# Patient Record
Sex: Male | Born: 1975 | Race: White | Hispanic: No | Marital: Single | Smoking: Never smoker
Health system: Southern US, Community
[De-identification: ages and names within clinical notes are randomized; demographics above are authoritative.]

## PROBLEM LIST (undated history)

## (undated) DIAGNOSIS — K921 Melena: Secondary | ICD-10-CM

## (undated) DIAGNOSIS — Z8619 Personal history of other infectious and parasitic diseases: Secondary | ICD-10-CM

## (undated) DIAGNOSIS — G43909 Migraine, unspecified, not intractable, without status migrainosus: Secondary | ICD-10-CM

## (undated) DIAGNOSIS — N200 Calculus of kidney: Secondary | ICD-10-CM

## (undated) DIAGNOSIS — K219 Gastro-esophageal reflux disease without esophagitis: Secondary | ICD-10-CM

## (undated) DIAGNOSIS — J9383 Other pneumothorax: Secondary | ICD-10-CM

## (undated) DIAGNOSIS — T7840XA Allergy, unspecified, initial encounter: Secondary | ICD-10-CM

## (undated) HISTORY — DX: Calculus of kidney: N20.0

## (undated) HISTORY — DX: Other pneumothorax: J93.83

## (undated) HISTORY — DX: Migraine, unspecified, not intractable, without status migrainosus: G43.909

## (undated) HISTORY — DX: Gastro-esophageal reflux disease without esophagitis: K21.9

## (undated) HISTORY — DX: Personal history of other infectious and parasitic diseases: Z86.19

## (undated) HISTORY — DX: Melena: K92.1

## (undated) HISTORY — DX: Allergy, unspecified, initial encounter: T78.40XA

---
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
## 1999-07-22 ENCOUNTER — Emergency Department (HOSPITAL_COMMUNITY): Admission: EM | Admit: 1999-07-22 | Discharge: 1999-07-22 | Payer: Self-pay | Admitting: Emergency Medicine

## 2003-05-16 ENCOUNTER — Encounter: Payer: Self-pay | Admitting: Family Medicine

## 2003-05-16 ENCOUNTER — Ambulatory Visit (HOSPITAL_COMMUNITY): Admission: RE | Admit: 2003-05-16 | Discharge: 2003-05-16 | Payer: Self-pay | Admitting: Family Medicine

## 2003-05-22 ENCOUNTER — Ambulatory Visit (HOSPITAL_COMMUNITY): Admission: RE | Admit: 2003-05-22 | Discharge: 2003-05-22 | Payer: Self-pay | Admitting: Family Medicine

## 2003-05-22 ENCOUNTER — Encounter: Payer: Self-pay | Admitting: Family Medicine

## 2003-05-26 ENCOUNTER — Encounter: Payer: Self-pay | Admitting: Family Medicine

## 2003-05-26 ENCOUNTER — Ambulatory Visit (HOSPITAL_COMMUNITY): Admission: RE | Admit: 2003-05-26 | Discharge: 2003-05-26 | Payer: Self-pay | Admitting: Family Medicine

## 2010-12-29 LAB — HM COLONOSCOPY

## 2012-07-16 ENCOUNTER — Telehealth: Payer: Self-pay | Admitting: Internal Medicine

## 2012-07-16 NOTE — Telephone Encounter (Signed)
Spoke with mother, pt scheduled 08/09/2012 @ 9:45am

## 2012-07-16 NOTE — Telephone Encounter (Signed)
Ok Thx 

## 2012-08-09 ENCOUNTER — Encounter: Payer: Self-pay | Admitting: Internal Medicine

## 2012-08-09 ENCOUNTER — Ambulatory Visit (INDEPENDENT_AMBULATORY_CARE_PROVIDER_SITE_OTHER): Payer: Managed Care, Other (non HMO) | Admitting: Internal Medicine

## 2012-08-09 ENCOUNTER — Other Ambulatory Visit (INDEPENDENT_AMBULATORY_CARE_PROVIDER_SITE_OTHER): Payer: Managed Care, Other (non HMO)

## 2012-08-09 VITALS — BP 122/88 | HR 103 | Temp 98.3°F | Ht 76.0 in | Wt 267.0 lb

## 2012-08-09 DIAGNOSIS — R0600 Dyspnea, unspecified: Secondary | ICD-10-CM

## 2012-08-09 DIAGNOSIS — E669 Obesity, unspecified: Secondary | ICD-10-CM | POA: Insufficient documentation

## 2012-08-09 DIAGNOSIS — R1012 Left upper quadrant pain: Secondary | ICD-10-CM

## 2012-08-09 DIAGNOSIS — I10 Essential (primary) hypertension: Secondary | ICD-10-CM

## 2012-08-09 DIAGNOSIS — F32A Depression, unspecified: Secondary | ICD-10-CM

## 2012-08-09 DIAGNOSIS — F329 Major depressive disorder, single episode, unspecified: Secondary | ICD-10-CM | POA: Insufficient documentation

## 2012-08-09 DIAGNOSIS — R0989 Other specified symptoms and signs involving the circulatory and respiratory systems: Secondary | ICD-10-CM

## 2012-08-09 DIAGNOSIS — K219 Gastro-esophageal reflux disease without esophagitis: Secondary | ICD-10-CM

## 2012-08-09 DIAGNOSIS — F419 Anxiety disorder, unspecified: Secondary | ICD-10-CM | POA: Insufficient documentation

## 2012-08-09 DIAGNOSIS — F341 Dysthymic disorder: Secondary | ICD-10-CM

## 2012-08-09 LAB — HEPATIC FUNCTION PANEL
Albumin: 4.9 g/dL (ref 3.5–5.2)
Total Bilirubin: 1.1 mg/dL (ref 0.3–1.2)

## 2012-08-09 LAB — CBC WITH DIFFERENTIAL/PLATELET
Basophils Absolute: 0 K/uL (ref 0.0–0.1)
Basophils Relative: 0.4 % (ref 0.0–3.0)
Eosinophils Absolute: 0.4 K/uL (ref 0.0–0.7)
Eosinophils Relative: 3.3 % (ref 0.0–5.0)
HCT: 44.3 % (ref 39.0–52.0)
Hemoglobin: 14.5 g/dL (ref 13.0–17.0)
Lymphocytes Relative: 26 % (ref 12.0–46.0)
Lymphs Abs: 2.8 K/uL (ref 0.7–4.0)
MCHC: 32.8 g/dL (ref 30.0–36.0)
MCV: 94.9 fl (ref 78.0–100.0)
Monocytes Absolute: 0.7 K/uL (ref 0.1–1.0)
Monocytes Relative: 7 % (ref 3.0–12.0)
Neutro Abs: 6.7 K/uL (ref 1.4–7.7)
Neutrophils Relative %: 63.3 % (ref 43.0–77.0)
Platelets: 250 K/uL (ref 150.0–400.0)
RBC: 4.67 Mil/uL (ref 4.22–5.81)
RDW: 14 % (ref 11.5–14.6)
WBC: 10.6 K/uL — ABNORMAL HIGH (ref 4.5–10.5)

## 2012-08-09 LAB — BASIC METABOLIC PANEL WITH GFR
BUN: 20 mg/dL (ref 6–23)
CO2: 27 meq/L (ref 19–32)
Calcium: 9.9 mg/dL (ref 8.4–10.5)
Chloride: 100 meq/L (ref 96–112)
Creatinine, Ser: 1.2 mg/dL (ref 0.4–1.5)
GFR: 70.43 mL/min
Glucose, Bld: 85 mg/dL (ref 70–99)
Potassium: 4.4 meq/L (ref 3.5–5.1)
Sodium: 137 meq/L (ref 135–145)

## 2012-08-09 LAB — URINALYSIS
Bilirubin Urine: NEGATIVE
Hgb urine dipstick: NEGATIVE
Ketones, ur: NEGATIVE
Total Protein, Urine: NEGATIVE
Urine Glucose: NEGATIVE
Urobilinogen, UA: 0.2 (ref 0.0–1.0)

## 2012-08-09 LAB — H. PYLORI ANTIBODY, IGG: H Pylori IgG: NEGATIVE

## 2012-08-09 LAB — LIPID PANEL
Cholesterol: 245 mg/dL — ABNORMAL HIGH (ref 0–200)
HDL: 54.2 mg/dL (ref >39.00)
Total CHOL/HDL Ratio: 5
VLDL: 32.6 mg/dL (ref 0.0–40.0)

## 2012-08-09 MED ORDER — ALPRAZOLAM 1 MG PO TABS: 1.0000 mg | ORAL_TABLET | Freq: Two times a day (BID) | ORAL | Status: DC | PRN

## 2012-08-09 MED ORDER — LOSARTAN POTASSIUM 100 MG PO TABS: 100.0000 mg | ORAL_TABLET | Freq: Every day | ORAL | Status: DC

## 2012-08-09 MED ORDER — VITAMIN D 1000 UNITS PO TABS: 1000.0000 [IU] | ORAL_TABLET | Freq: Every day | ORAL | Status: AC

## 2012-08-09 MED ORDER — BUPROPION HCL ER (XL) 150 MG PO TB24: 150.0000 mg | ORAL_TABLET | Freq: Every day | ORAL | Status: DC

## 2012-08-09 NOTE — Assessment & Plan Note (Signed)
Omeprazole 40 mg/d Loose wt Labs

## 2012-08-09 NOTE — Progress Notes (Signed)
  Subjective:    Patient ID: Jacob Blackburn, male    DOB: 1976-03-03, 36 y.o.   MRN: [id number]  HPI A new pt C/o chronic abd pain - dx'd w/IBS by Dr Orlean Bradford (GI) -- LUQ - better now. He had a colonoscopy done C/o HTN, obesity, anxiety - worse -- stressful job C/o SOB after he started Lisinopril    Review of Systems  Constitutional: Negative for appetite change, fatigue and unexpected weight change.  HENT: Negative for nosebleeds, congestion, sore throat, sneezing, trouble swallowing and neck pain.   Eyes: Negative for itching and visual disturbance.  Respiratory: Negative for cough.   Cardiovascular: Negative for chest pain, palpitations and leg swelling.  Gastrointestinal: Positive for abdominal pain (LUQ). Negative for nausea, vomiting, diarrhea, blood in stool and abdominal distention.  Genitourinary: Negative for frequency and hematuria.  Musculoskeletal: Negative for back pain, joint swelling and gait problem.  Skin: Negative for rash.  Neurological: Negative for dizziness, tremors, speech difficulty and weakness.  Psychiatric/Behavioral: Negative for suicidal ideas, disturbed wake/sleep cycle, dysphoric mood and agitation. The patient is nervous/anxious.    BP Readings from Last 3 Encounters:  08/09/12 122/88   Wt Readings from Last 3 Encounters:  08/09/12 267 lb (121.11 kg)        Objective:   Physical Exam  Constitutional: He is oriented to person, place, and time. He appears well-developed.       obese  HENT:  Mouth/Throat: Oropharynx is clear and moist.  Eyes: Conjunctivae are normal. Pupils are equal, round, and reactive to light.  Neck: Normal range of motion. No JVD present. No thyromegaly present.  Cardiovascular: Normal rate, regular rhythm, normal heart sounds and intact distal pulses.  Exam reveals no gallop and no friction rub.   No murmur heard. Pulmonary/Chest: Effort normal and breath sounds normal. No respiratory distress. He has no wheezes. He has no  rales. He exhibits no tenderness.  Abdominal: Soft. Bowel sounds are normal. He exhibits no distension and no mass. There is no tenderness. There is no rebound and no guarding.  Musculoskeletal: Normal range of motion. He exhibits no edema and no tenderness.  Lymphadenopathy:    He has no cervical adenopathy.  Neurological: He is alert and oriented to person, place, and time. He has normal reflexes. No cranial nerve deficit. He exhibits normal muscle tone. Coordination normal.  Skin: Skin is warm and dry. No rash noted.  Psychiatric: He has a normal mood and affect. His behavior is normal. Judgment and thought content normal.   No results found for this basename: WBC, HGB, HCT, PLT, GLUCOSE, CHOL, TRIG, HDL, LDLDIRECT, LDLCALC, ALT, AST, NA, K, CL, CREATININE, BUN, CO2, TSH, PSA, INR, GLUF, HGBA1C, MICROALBUR          Assessment & Plan:

## 2012-08-09 NOTE — Assessment & Plan Note (Signed)
DASH diet - loosing wt

## 2012-08-09 NOTE — Assessment & Plan Note (Signed)
Start Losartan 

## 2012-08-09 NOTE — Assessment & Plan Note (Signed)
2012 Dr Orlean Bradford GI - he had a colonoscopy -- IBS

## 2012-08-09 NOTE — Assessment & Plan Note (Signed)
Increase Xanax w/caution Trial of Wellbutrin

## 2012-08-09 NOTE — Assessment & Plan Note (Signed)
D/c Lisinopril 

## 2012-08-10 ENCOUNTER — Telehealth: Payer: Self-pay | Admitting: Internal Medicine

## 2012-08-10 NOTE — Telephone Encounter (Signed)
Mother notified per MD. 

## 2012-08-10 NOTE — Telephone Encounter (Signed)
Jacob Blackburn, please, inform patient that all labs are normal except for elev chol and one elev liver test. Cont w/wt loss. Keep ROV  Please, mail the labs to the patient.     Thx

## 2012-10-25 ENCOUNTER — Ambulatory Visit (INDEPENDENT_AMBULATORY_CARE_PROVIDER_SITE_OTHER): Payer: Managed Care, Other (non HMO) | Admitting: Internal Medicine

## 2012-10-25 ENCOUNTER — Encounter: Payer: Self-pay | Admitting: Internal Medicine

## 2012-10-25 VITALS — BP 110/72 | HR 88 | Temp 98.9°F | Resp 16 | Wt 251.0 lb

## 2012-10-25 DIAGNOSIS — K219 Gastro-esophageal reflux disease without esophagitis: Secondary | ICD-10-CM

## 2012-10-25 DIAGNOSIS — R0609 Other forms of dyspnea: Secondary | ICD-10-CM

## 2012-10-25 DIAGNOSIS — F329 Major depressive disorder, single episode, unspecified: Secondary | ICD-10-CM

## 2012-10-25 DIAGNOSIS — F341 Dysthymic disorder: Secondary | ICD-10-CM

## 2012-10-25 DIAGNOSIS — Z23 Encounter for immunization: Secondary | ICD-10-CM

## 2012-10-25 DIAGNOSIS — R7989 Other specified abnormal findings of blood chemistry: Secondary | ICD-10-CM

## 2012-10-25 DIAGNOSIS — I10 Essential (primary) hypertension: Secondary | ICD-10-CM

## 2012-10-25 DIAGNOSIS — R0989 Other specified symptoms and signs involving the circulatory and respiratory systems: Secondary | ICD-10-CM

## 2012-10-25 DIAGNOSIS — F32A Depression, unspecified: Secondary | ICD-10-CM

## 2012-10-25 DIAGNOSIS — R0600 Dyspnea, unspecified: Secondary | ICD-10-CM

## 2012-10-25 MED ORDER — ALPRAZOLAM 1 MG PO TABS: 1.0000 mg | ORAL_TABLET | Freq: Two times a day (BID) | ORAL | Status: DC | PRN

## 2012-10-25 NOTE — Assessment & Plan Note (Signed)
Continue with current prescription therapy as reflected on the Med list.  

## 2012-10-25 NOTE — Assessment & Plan Note (Signed)
Much better 

## 2012-10-25 NOTE — Assessment & Plan Note (Signed)
Resolved

## 2012-10-25 NOTE — Progress Notes (Signed)
Patient ID: Jacob Blackburn, male   DOB: 04/30/1976, 36 y.o.   MRN: [id number]  Subjective:    Patient ID: Jacob Blackburn, male    DOB: 1976/10/09, 36 y.o.   MRN: [id number]  HPI  F/u chronic abd pain - dx'd w/IBS by Dr Orlean Bradford (GI) -- LUQ - better now. He had a colonoscopy done F/u HTN, obesity, anxiety - better F/u SOB after he stopped Lisinopril    Review of Systems  Constitutional: Negative for appetite change, fatigue and unexpected weight change.  HENT: Negative for nosebleeds, congestion, sore throat, sneezing, trouble swallowing and neck pain.   Eyes: Negative for itching and visual disturbance.  Respiratory: Negative for cough.   Cardiovascular: Negative for chest pain, palpitations and leg swelling.  Gastrointestinal: Positive for abdominal pain (LUQ). Negative for nausea, vomiting, diarrhea, blood in stool and abdominal distention.  Genitourinary: Negative for frequency and hematuria.  Musculoskeletal: Negative for back pain, joint swelling and gait problem.  Skin: Negative for rash.  Neurological: Negative for dizziness, tremors, speech difficulty and weakness.  Psychiatric/Behavioral: Negative for suicidal ideas, disturbed wake/sleep cycle, dysphoric mood and agitation. The patient is nervous/anxious.    BP Readings from Last 3 Encounters:  10/25/12 110/72  08/09/12 122/88   Wt Readings from Last 3 Encounters:  10/25/12 251 lb (113.853 kg)  08/09/12 267 lb (121.11 kg)        Objective:   Physical Exam  Constitutional: He is oriented to person, place, and time. He appears well-developed.       obese  HENT:  Mouth/Throat: Oropharynx is clear and moist.  Eyes: Conjunctivae normal are normal. Pupils are equal, round, and reactive to light.  Neck: Normal range of motion. No JVD present. No thyromegaly present.  Cardiovascular: Normal rate, regular rhythm, normal heart sounds and intact distal pulses.  Exam reveals no gallop and no friction rub.   No murmur  heard. Pulmonary/Chest: Effort normal and breath sounds normal. No respiratory distress. He has no wheezes. He has no rales. He exhibits no tenderness.  Abdominal: Soft. Bowel sounds are normal. He exhibits no distension and no mass. There is no tenderness. There is no rebound and no guarding.  Musculoskeletal: Normal range of motion. He exhibits no edema and no tenderness.  Lymphadenopathy:    He has no cervical adenopathy.  Neurological: He is alert and oriented to person, place, and time. He has normal reflexes. No cranial nerve deficit. He exhibits normal muscle tone. Coordination normal.  Skin: Skin is warm and dry. No rash noted.  Psychiatric: He has a normal mood and affect. His behavior is normal. Judgment and thought content normal.   Lab Results  Component Value Date   WBC 10.6* 08/09/2012   HGB 14.5 08/09/2012   HCT 44.3 08/09/2012   PLT 250.0 08/09/2012   GLUCOSE 85 08/09/2012   CHOL 245* 08/09/2012   TRIG 163.0* 08/09/2012   HDL 54.20 08/09/2012   LDLDIRECT 174.8 08/09/2012   ALT 79* 08/09/2012   AST 32 08/09/2012   NA 137 08/09/2012   K 4.4 08/09/2012   CL 100 08/09/2012   CREATININE 1.2 08/09/2012   BUN 20 08/09/2012   CO2 27 08/09/2012   TSH 4.82 08/09/2012           Assessment & Plan:

## 2012-10-25 NOTE — Assessment & Plan Note (Signed)
Better  

## 2012-12-10 ENCOUNTER — Other Ambulatory Visit: Payer: Self-pay | Admitting: Internal Medicine

## 2012-12-10 NOTE — Telephone Encounter (Signed)
Pt left msg on vm his last rx was incorrect on his alprazolam. Pt states he normally get # 60 was given only 30 tab. Requesting rx for alprazolam # 60, also need refill on his omeprazole...Raechel Chute

## 2012-12-10 NOTE — Telephone Encounter (Signed)
Ok to Rf? 

## 2012-12-14 ENCOUNTER — Telehealth: Payer: Self-pay | Admitting: Internal Medicine

## 2012-12-14 MED ORDER — OMEPRAZOLE 40 MG PO CPDR: 40.0000 mg | DELAYED_RELEASE_CAPSULE | Freq: Every day | ORAL | Status: DC

## 2012-12-14 NOTE — Telephone Encounter (Signed)
Done

## 2012-12-14 NOTE — Telephone Encounter (Signed)
Ok Thx 

## 2012-12-14 NOTE — Telephone Encounter (Signed)
Pt called requesting his 2 refills.

## 2012-12-14 NOTE — Telephone Encounter (Signed)
Done. See Rx encounter.

## 2013-01-10 ENCOUNTER — Telehealth: Payer: Self-pay | Admitting: Internal Medicine

## 2013-01-10 NOTE — Telephone Encounter (Signed)
Requesting a refill on Xanax 1mg  quantity of 60 which will be due around Jan 17.

## 2013-01-10 NOTE — Telephone Encounter (Signed)
OK to fill this prescription with additional refills x2 Thank you!  

## 2013-01-11 MED ORDER — ALPRAZOLAM 1 MG PO TABS: 1.0000 mg | ORAL_TABLET | Freq: Two times a day (BID) | ORAL | Status: DC | PRN

## 2013-01-11 NOTE — Telephone Encounter (Signed)
Done

## 2013-01-31 ENCOUNTER — Ambulatory Visit (INDEPENDENT_AMBULATORY_CARE_PROVIDER_SITE_OTHER): Payer: Managed Care, Other (non HMO) | Admitting: Internal Medicine

## 2013-01-31 ENCOUNTER — Encounter: Payer: Self-pay | Admitting: Internal Medicine

## 2013-01-31 ENCOUNTER — Telehealth: Payer: Self-pay | Admitting: Internal Medicine

## 2013-01-31 ENCOUNTER — Other Ambulatory Visit (INDEPENDENT_AMBULATORY_CARE_PROVIDER_SITE_OTHER): Payer: Managed Care, Other (non HMO)

## 2013-01-31 VITALS — BP 122/88 | HR 96 | Temp 98.5°F | Resp 16 | Wt 230.0 lb

## 2013-01-31 DIAGNOSIS — R7989 Other specified abnormal findings of blood chemistry: Secondary | ICD-10-CM

## 2013-01-31 DIAGNOSIS — R5383 Other fatigue: Secondary | ICD-10-CM

## 2013-01-31 DIAGNOSIS — R5381 Other malaise: Secondary | ICD-10-CM

## 2013-01-31 DIAGNOSIS — I10 Essential (primary) hypertension: Secondary | ICD-10-CM

## 2013-01-31 DIAGNOSIS — K219 Gastro-esophageal reflux disease without esophagitis: Secondary | ICD-10-CM

## 2013-01-31 LAB — LIPID PANEL
Total CHOL/HDL Ratio: 7
VLDL: 20 mg/dL (ref 0.0–40.0)

## 2013-01-31 LAB — HEPATIC FUNCTION PANEL
Albumin: 4.5 g/dL (ref 3.5–5.2)
Alkaline Phosphatase: 91 U/L (ref 39–117)
Bilirubin, Direct: 0.1 mg/dL (ref 0.0–0.3)

## 2013-01-31 LAB — BASIC METABOLIC PANEL
BUN: 11 mg/dL (ref 6–23)
CO2: 29 mEq/L (ref 19–32)
Chloride: 104 mEq/L (ref 96–112)
Creatinine, Ser: 1.3 mg/dL (ref 0.4–1.5)

## 2013-01-31 MED ORDER — BUPROPION HCL ER (XL) 150 MG PO TB24: 150.0000 mg | ORAL_TABLET | Freq: Every day | ORAL | Status: DC

## 2013-01-31 MED ORDER — OMEPRAZOLE 40 MG PO CPDR: 40.0000 mg | DELAYED_RELEASE_CAPSULE | Freq: Every day | ORAL | Status: DC

## 2013-01-31 MED ORDER — ALPRAZOLAM 1 MG PO TABS: 1.0000 mg | ORAL_TABLET | Freq: Two times a day (BID) | ORAL | Status: DC | PRN

## 2013-01-31 NOTE — Assessment & Plan Note (Signed)
Labs incl testosterone 

## 2013-01-31 NOTE — Progress Notes (Signed)
  Subjective:    HPI  F/u chronic abd pain - dx'd w/IBS by Dr Orlean Bradford (GI) -- LUQ - overall better now. He had a colonoscopy done F/u HTN, obesity, anxiety - better Loosing wt on diet    Review of Systems  Constitutional: Negative for appetite change, fatigue and unexpected weight change.  HENT: Negative for nosebleeds, congestion, sore throat, sneezing, trouble swallowing and neck pain.   Eyes: Negative for itching and visual disturbance.  Respiratory: Negative for cough.   Cardiovascular: Negative for chest pain, palpitations and leg swelling.  Gastrointestinal: Positive for abdominal pain (LUQ). Negative for nausea, vomiting, diarrhea, blood in stool and abdominal distention.  Genitourinary: Negative for frequency and hematuria.  Musculoskeletal: Negative for back pain, joint swelling and gait problem.  Skin: Negative for rash.  Neurological: Negative for dizziness, tremors, speech difficulty and weakness.  Psychiatric/Behavioral: Negative for suicidal ideas, sleep disturbance, dysphoric mood and agitation. The patient is nervous/anxious.    BP Readings from Last 3 Encounters:  01/31/13 122/88  10/25/12 110/72  08/09/12 122/88   Wt Readings from Last 3 Encounters:  01/31/13 230 lb (104.327 kg)  10/25/12 251 lb (113.853 kg)  08/09/12 267 lb (121.11 kg)        Objective:   Physical Exam  Constitutional: He is oriented to person, place, and time. He appears well-developed.       obese  HENT:  Mouth/Throat: Oropharynx is clear and moist.  Eyes: Conjunctivae normal are normal. Pupils are equal, round, and reactive to light.  Neck: Normal range of motion. No JVD present. No thyromegaly present.  Cardiovascular: Normal rate, regular rhythm, normal heart sounds and intact distal pulses.  Exam reveals no gallop and no friction rub.   No murmur heard. Pulmonary/Chest: Effort normal and breath sounds normal. No respiratory distress. He has no wheezes. He has no rales. He exhibits  no tenderness.  Abdominal: Soft. Bowel sounds are normal. He exhibits no distension and no mass. There is no tenderness. There is no rebound and no guarding.  Musculoskeletal: Normal range of motion. He exhibits no edema and no tenderness.  Lymphadenopathy:    He has no cervical adenopathy.  Neurological: He is alert and oriented to person, place, and time. He has normal reflexes. No cranial nerve deficit. He exhibits normal muscle tone. Coordination normal.  Skin: Skin is warm and dry. No rash noted.  Psychiatric: He has a normal mood and affect. His behavior is normal. Judgment and thought content normal.   Lab Results  Component Value Date   WBC 10.6* 08/09/2012   HGB 14.5 08/09/2012   HCT 44.3 08/09/2012   PLT 250.0 08/09/2012   GLUCOSE 85 08/09/2012   CHOL 245* 08/09/2012   TRIG 163.0* 08/09/2012   HDL 54.20 08/09/2012   LDLDIRECT 174.8 08/09/2012   ALT 79* 08/09/2012   AST 32 08/09/2012   NA 137 08/09/2012   K 4.4 08/09/2012   CL 100 08/09/2012   CREATININE 1.2 08/09/2012   BUN 20 08/09/2012   CO2 27 08/09/2012   TSH 4.82 08/09/2012           Assessment & Plan:

## 2013-01-31 NOTE — Telephone Encounter (Signed)
Jacob Blackburn - pls call labs/tests are good. Your testosterone was low however. Start exercising (<45 min a day muscle building - pushups, crunches an pullups are ok). Take Vit D. Eat wallnuts, almonds. We will repeat labs and get some additional tests in 1 month. Thx

## 2013-01-31 NOTE — Assessment & Plan Note (Signed)
Discussed.

## 2013-01-31 NOTE — Assessment & Plan Note (Signed)
Repeat labs He lost wt

## 2013-01-31 NOTE — Assessment & Plan Note (Signed)
Continue with current prescription therapy as reflected on the Med list.  

## 2013-02-01 NOTE — Telephone Encounter (Signed)
Called pt no answer/machine.   Will try again later.

## 2013-02-03 NOTE — Telephone Encounter (Signed)
Pt informed

## 2013-08-10 ENCOUNTER — Ambulatory Visit (INDEPENDENT_AMBULATORY_CARE_PROVIDER_SITE_OTHER): Payer: Managed Care, Other (non HMO)

## 2013-08-10 ENCOUNTER — Encounter: Payer: Self-pay | Admitting: Internal Medicine

## 2013-08-10 ENCOUNTER — Ambulatory Visit (INDEPENDENT_AMBULATORY_CARE_PROVIDER_SITE_OTHER): Payer: Managed Care, Other (non HMO) | Admitting: Internal Medicine

## 2013-08-10 VITALS — BP 110/82 | HR 84 | Temp 98.1°F | Resp 16 | Ht 76.0 in | Wt 216.0 lb

## 2013-08-10 DIAGNOSIS — R0609 Other forms of dyspnea: Secondary | ICD-10-CM

## 2013-08-10 DIAGNOSIS — K219 Gastro-esophageal reflux disease without esophagitis: Secondary | ICD-10-CM

## 2013-08-10 DIAGNOSIS — F419 Anxiety disorder, unspecified: Secondary | ICD-10-CM

## 2013-08-10 DIAGNOSIS — R7989 Other specified abnormal findings of blood chemistry: Secondary | ICD-10-CM

## 2013-08-10 DIAGNOSIS — E291 Testicular hypofunction: Secondary | ICD-10-CM

## 2013-08-10 DIAGNOSIS — F32A Depression, unspecified: Secondary | ICD-10-CM

## 2013-08-10 DIAGNOSIS — I10 Essential (primary) hypertension: Secondary | ICD-10-CM

## 2013-08-10 DIAGNOSIS — F341 Dysthymic disorder: Secondary | ICD-10-CM

## 2013-08-10 DIAGNOSIS — Z0000 Encounter for general adult medical examination without abnormal findings: Secondary | ICD-10-CM

## 2013-08-10 DIAGNOSIS — F329 Major depressive disorder, single episode, unspecified: Secondary | ICD-10-CM

## 2013-08-10 DIAGNOSIS — R0600 Dyspnea, unspecified: Secondary | ICD-10-CM

## 2013-08-10 DIAGNOSIS — R0989 Other specified symptoms and signs involving the circulatory and respiratory systems: Secondary | ICD-10-CM

## 2013-08-10 LAB — FOLLICLE STIMULATING HORMONE: FSH: 2.5 m[IU]/mL (ref 1.4–18.1)

## 2013-08-10 LAB — BASIC METABOLIC PANEL
CO2: 26 mEq/L (ref 19–32)
Chloride: 104 mEq/L (ref 96–112)
Potassium: 4.1 mEq/L (ref 3.5–5.1)
Sodium: 136 mEq/L (ref 135–145)

## 2013-08-10 LAB — HEPATIC FUNCTION PANEL: Albumin: 4.5 g/dL (ref 3.5–5.2)

## 2013-08-10 LAB — LIPID PANEL
Cholesterol: 245 mg/dL — ABNORMAL HIGH (ref 0–200)
Total CHOL/HDL Ratio: 5

## 2013-08-10 LAB — LUTEINIZING HORMONE: LH: 6.47 m[IU]/mL (ref 1.50–9.30)

## 2013-08-10 MED ORDER — ALPRAZOLAM 1 MG PO TABS: 1.0000 mg | ORAL_TABLET | Freq: Two times a day (BID) | ORAL | Status: DC | PRN

## 2013-08-10 MED ORDER — LOSARTAN POTASSIUM 100 MG PO TABS: 100.0000 mg | ORAL_TABLET | Freq: Every day | ORAL | Status: DC

## 2013-08-10 MED ORDER — OMEPRAZOLE 20 MG PO CPDR: 20.0000 mg | DELAYED_RELEASE_CAPSULE | Freq: Every day | ORAL | Status: DC

## 2013-08-10 MED ORDER — BUPROPION HCL ER (XL) 150 MG PO TB24: 150.0000 mg | ORAL_TABLET | Freq: Every day | ORAL | Status: DC

## 2013-08-10 NOTE — Assessment & Plan Note (Signed)
We discussed age appropriate health related issues, including available/recomended screening tests and vaccinations. We discussed a need for adhering to healthy diet and exercise. Labs/EKG were reviewed/ordered. All questions were answered.   

## 2013-08-10 NOTE — Progress Notes (Signed)
  Subjective:    HPI  The patient is here for a wellness exam. The patient has been doing well overall without major physical or psychological issues going on lately.  The patient needs to address  chronic hypertension that has been well controlled with medicines; to address chronic  hyperlipidemia controlled with medicines as well; and to address type 2 chronic diabetes, controlled with medical treatment and diet.  He had a colonoscopy done F/u HTN, obesity, anxiety - better Loosing wt on diet    Review of Systems  Constitutional: Negative for appetite change, fatigue and unexpected weight change.  HENT: Negative for nosebleeds, congestion, sore throat, sneezing, trouble swallowing and neck pain.   Eyes: Negative for itching and visual disturbance.  Respiratory: Negative for cough.   Cardiovascular: Negative for chest pain, palpitations and leg swelling.  Gastrointestinal: Positive for abdominal pain (LUQ). Negative for nausea, vomiting, diarrhea, blood in stool and abdominal distention.  Genitourinary: Negative for frequency and hematuria.  Musculoskeletal: Negative for back pain, joint swelling and gait problem.  Skin: Negative for rash.  Neurological: Negative for dizziness, tremors, speech difficulty and weakness.  Psychiatric/Behavioral: Negative for suicidal ideas, sleep disturbance, dysphoric mood and agitation. The patient is nervous/anxious.    BP Readings from Last 3 Encounters:  08/10/13 110/82  01/31/13 122/88  10/25/12 110/72   Wt Readings from Last 3 Encounters:  08/10/13 216 lb (97.977 kg)  01/31/13 230 lb (104.327 kg)  10/25/12 251 lb (113.853 kg)        Objective:   Physical Exam  Constitutional: He is oriented to person, place, and time. He appears well-developed.  obese  HENT:  Mouth/Throat: Oropharynx is clear and moist.  Poor dentition  Eyes: Conjunctivae are normal. Pupils are equal, round, and reactive to light.  Neck: Normal range of motion.  No JVD present. No thyromegaly present.  Cardiovascular: Normal rate, regular rhythm, normal heart sounds and intact distal pulses.  Exam reveals no gallop and no friction rub.   No murmur heard. Pulmonary/Chest: Effort normal and breath sounds normal. No respiratory distress. He has no wheezes. He has no rales. He exhibits no tenderness.  Abdominal: Soft. Bowel sounds are normal. He exhibits no distension and no mass. There is no tenderness. There is no rebound and no guarding.  Musculoskeletal: Normal range of motion. He exhibits no edema and no tenderness.  Lymphadenopathy:    He has no cervical adenopathy.  Neurological: He is alert and oriented to person, place, and time. He has normal reflexes. No cranial nerve deficit. He exhibits normal muscle tone. Coordination normal.  Skin: Skin is warm and dry. No rash noted.  Psychiatric: He has a normal mood and affect. His behavior is normal. Judgment and thought content normal.   Lab Results  Component Value Date   WBC 10.6* 08/09/2012   HGB 14.5 08/09/2012   HCT 44.3 08/09/2012   PLT 250.0 08/09/2012   GLUCOSE 89 01/31/2013   CHOL 237* 01/31/2013   TRIG 100.0 01/31/2013   HDL 36.30* 01/31/2013   LDLDIRECT 170.0 01/31/2013   ALT 20 01/31/2013   AST 14 01/31/2013   NA 140 01/31/2013   K 4.4 01/31/2013   CL 104 01/31/2013   CREATININE 1.3 01/31/2013   BUN 11 01/31/2013   CO2 29 01/31/2013   TSH 4.82 08/09/2012           Assessment & Plan:

## 2013-08-10 NOTE — Assessment & Plan Note (Signed)
BP Readings from Last 3 Encounters:  08/10/13 110/82  01/31/13 122/88  10/25/12 110/72

## 2013-08-10 NOTE — Assessment & Plan Note (Signed)
Continue with current prescription therapy as reflected on the Med list.  

## 2013-08-10 NOTE — Assessment & Plan Note (Signed)
Wt Readings from Last 3 Encounters:  08/10/13 216 lb (97.977 kg)  01/31/13 230 lb (104.327 kg)  10/25/12 251 lb (113.853 kg)

## 2013-08-10 NOTE — Assessment & Plan Note (Signed)
labs

## 2013-08-11 LAB — TESTOSTERONE, FREE, TOTAL, SHBG
Sex Hormone Binding: 38 nmol/L (ref 13–71)
Testosterone, Free: 73.5 pg/mL (ref 47.0–244.0)
Testosterone-% Free: 1.9 % (ref 1.6–2.9)

## 2014-02-10 ENCOUNTER — Other Ambulatory Visit: Payer: Self-pay | Admitting: Internal Medicine

## 2014-08-23 ENCOUNTER — Other Ambulatory Visit: Payer: Self-pay | Admitting: Internal Medicine

## 2014-09-07 ENCOUNTER — Other Ambulatory Visit: Payer: Self-pay | Admitting: Internal Medicine

## 2014-09-15 NOTE — Telephone Encounter (Signed)
Patient has called back in regards.  He has been out for five day now!  Pharmacy states they have not received anything.  Patient uses [facility] on [address]

## 2014-09-15 NOTE — Telephone Encounter (Signed)
Pt father called in about pt refills on ALPRAZolam Prudy Feeler) 1 MG tablet [Pharmacy Med Name: ALPRAZOLAM  TABLETS] He said he has checked pharmacy twice.     Thank you!

## 2014-09-17 NOTE — Telephone Encounter (Signed)
sch OV pls 

## 2014-09-18 ENCOUNTER — Other Ambulatory Visit: Payer: Self-pay | Admitting: Internal Medicine

## 2014-09-18 NOTE — Telephone Encounter (Signed)
Dr. Posey Rea approved.  Did scheduled OV for next month.  Please send to Pharmacy today.  Thanks!

## 2014-09-18 NOTE — Telephone Encounter (Signed)
Done

## 2014-10-17 ENCOUNTER — Other Ambulatory Visit: Payer: Self-pay | Admitting: Internal Medicine

## 2014-10-23 ENCOUNTER — Ambulatory Visit (INDEPENDENT_AMBULATORY_CARE_PROVIDER_SITE_OTHER): Payer: 59 | Admitting: Internal Medicine

## 2014-10-23 ENCOUNTER — Encounter: Payer: Self-pay | Admitting: Internal Medicine

## 2014-10-23 VITALS — BP 100/80 | HR 80 | Temp 98.1°F | Resp 16 | Wt 258.0 lb

## 2014-10-23 DIAGNOSIS — F419 Anxiety disorder, unspecified: Principal | ICD-10-CM

## 2014-10-23 DIAGNOSIS — Z0000 Encounter for general adult medical examination without abnormal findings: Secondary | ICD-10-CM

## 2014-10-23 DIAGNOSIS — Z23 Encounter for immunization: Secondary | ICD-10-CM

## 2014-10-23 DIAGNOSIS — F32A Depression, unspecified: Secondary | ICD-10-CM

## 2014-10-23 DIAGNOSIS — F418 Other specified anxiety disorders: Secondary | ICD-10-CM

## 2014-10-23 DIAGNOSIS — F329 Major depressive disorder, single episode, unspecified: Secondary | ICD-10-CM

## 2014-10-23 MED ORDER — OMEPRAZOLE 20 MG PO CPDR: 20.0000 mg | DELAYED_RELEASE_CAPSULE | Freq: Every day | ORAL | Status: DC

## 2014-10-23 MED ORDER — LOSARTAN POTASSIUM 100 MG PO TABS: ORAL_TABLET | ORAL | Status: DC

## 2014-10-23 MED ORDER — ALPRAZOLAM 1 MG PO TABS: ORAL_TABLET | ORAL | Status: DC

## 2014-10-23 MED ORDER — BUPROPION HCL ER (XL) 150 MG PO TB24: ORAL_TABLET | ORAL | Status: DC

## 2014-10-23 NOTE — Assessment & Plan Note (Signed)
Continue with current prescription therapy as reflected on the Med list.  

## 2014-10-23 NOTE — Progress Notes (Signed)
   Subjective:    HPI    The patient needs to address  chronic hypertension that has been well controlled with medicines; to address chronic  hyperlipidemia controlled with medicines as well; and to address type 2 chronic diabetes, controlled with medical treatment and diet.  He had a colonoscopy done F/u HTN, obesity, anxiety - better Loosing wt on diet    Review of Systems  Constitutional: Negative for appetite change, fatigue and unexpected weight change.  HENT: Negative for congestion, nosebleeds, sneezing, sore throat and trouble swallowing.   Eyes: Negative for itching and visual disturbance.  Respiratory: Negative for cough.   Cardiovascular: Negative for chest pain, palpitations and leg swelling.  Gastrointestinal: Positive for abdominal pain (LUQ). Negative for nausea, vomiting, diarrhea, blood in stool and abdominal distention.  Genitourinary: Negative for frequency and hematuria.  Musculoskeletal: Negative for back pain, gait problem, joint swelling and neck pain.  Skin: Negative for rash.  Neurological: Negative for dizziness, tremors, speech difficulty and weakness.  Psychiatric/Behavioral: Negative for suicidal ideas, sleep disturbance, dysphoric mood and agitation. The patient is nervous/anxious.    BP Readings from Last 3 Encounters:  10/23/14 100/80  08/10/13 110/82  01/31/13 122/88   Wt Readings from Last 3 Encounters:  10/23/14 258 lb (117.028 kg)  08/10/13 216 lb (97.977 kg)  01/31/13 230 lb (104.327 kg)        Objective:   Physical Exam  Constitutional: He is oriented to person, place, and time. He appears well-developed.  obese  HENT:  Mouth/Throat: Oropharynx is clear and moist.  Poor dentition  Eyes: Conjunctivae are normal. Pupils are equal, round, and reactive to light.  Neck: Normal range of motion. No JVD present. No thyromegaly present.  Cardiovascular: Normal rate, regular rhythm, normal heart sounds and intact distal pulses.  Exam  reveals no gallop and no friction rub.   No murmur heard. Pulmonary/Chest: Effort normal and breath sounds normal. No respiratory distress. He has no wheezes. He has no rales. He exhibits no tenderness.  Abdominal: Soft. Bowel sounds are normal. He exhibits no distension and no mass. There is no tenderness. There is no rebound and no guarding.  Musculoskeletal: Normal range of motion. He exhibits no edema and no tenderness.  Lymphadenopathy:    He has no cervical adenopathy.  Neurological: He is alert and oriented to person, place, and time. He has normal reflexes. No cranial nerve deficit. He exhibits normal muscle tone. Coordination normal.  Skin: Skin is warm and dry. No rash noted.  Psychiatric: He has a normal mood and affect. His behavior is normal. Judgment and thought content normal.   Lab Results  Component Value Date   WBC 10.6* 08/09/2012   HGB 14.5 08/09/2012   HCT 44.3 08/09/2012   PLT 250.0 08/09/2012   GLUCOSE 83 08/10/2013   CHOL 245* 08/10/2013   TRIG 98.0 08/10/2013   HDL 45.10 08/10/2013   LDLDIRECT 191.0 08/10/2013   ALT 33 08/10/2013   AST 16 08/10/2013   NA 136 08/10/2013   K 4.1 08/10/2013   CL 104 08/10/2013   CREATININE 1.2 08/10/2013   BUN 12 08/10/2013   CO2 26 08/10/2013   TSH 4.82 08/09/2012           Assessment & Plan:

## 2014-10-23 NOTE — Assessment & Plan Note (Signed)
Wt Readings from Last 3 Encounters:  10/23/14 258 lb (117.028 kg)  08/10/13 216 lb (97.977 kg)  01/31/13 230 lb (104.327 kg)

## 2014-10-23 NOTE — Progress Notes (Signed)
Pre visit review using our clinic review tool, if applicable. No additional management support is needed unless otherwise documented below in the visit note. 

## 2015-02-19 ENCOUNTER — Other Ambulatory Visit: Payer: Self-pay | Admitting: Internal Medicine

## 2015-02-21 NOTE — Telephone Encounter (Signed)
Needs OV.  

## 2015-02-22 ENCOUNTER — Telehealth: Payer: Self-pay | Admitting: Internal Medicine

## 2015-02-22 NOTE — Telephone Encounter (Signed)
I called Rf in per MD. See meds. I called [facility] and verified that they received my message. Pt informed and transferred to scheduler to schedule CPE.

## 2015-02-22 NOTE — Telephone Encounter (Signed)
Patient is requesting alprazolam.  Would like to know why refused.

## 2015-02-26 ENCOUNTER — Ambulatory Visit (INDEPENDENT_AMBULATORY_CARE_PROVIDER_SITE_OTHER): Payer: 59 | Admitting: Internal Medicine

## 2015-02-26 ENCOUNTER — Encounter: Payer: Self-pay | Admitting: Internal Medicine

## 2015-02-26 ENCOUNTER — Other Ambulatory Visit (INDEPENDENT_AMBULATORY_CARE_PROVIDER_SITE_OTHER): Payer: 59

## 2015-02-26 VITALS — BP 124/80 | HR 88 | Temp 98.6°F | Resp 16 | Wt 259.5 lb

## 2015-02-26 DIAGNOSIS — Z0000 Encounter for general adult medical examination without abnormal findings: Secondary | ICD-10-CM

## 2015-02-26 DIAGNOSIS — K0530 Chronic periodontitis, unspecified: Secondary | ICD-10-CM | POA: Insufficient documentation

## 2015-02-26 DIAGNOSIS — F329 Major depressive disorder, single episode, unspecified: Secondary | ICD-10-CM

## 2015-02-26 DIAGNOSIS — F419 Anxiety disorder, unspecified: Secondary | ICD-10-CM

## 2015-02-26 DIAGNOSIS — F418 Other specified anxiety disorders: Secondary | ICD-10-CM

## 2015-02-26 DIAGNOSIS — F32A Depression, unspecified: Secondary | ICD-10-CM

## 2015-02-26 LAB — HEPATIC FUNCTION PANEL
ALK PHOS: 83 U/L (ref 39–117)
ALT: 23 U/L (ref 0–53)
AST: 16 U/L (ref 0–37)
Albumin: 4.9 g/dL (ref 3.5–5.2)
BILIRUBIN TOTAL: 0.5 mg/dL (ref 0.2–1.2)
Bilirubin, Direct: 0.1 mg/dL (ref 0.0–0.3)
Total Protein: 8 g/dL (ref 6.0–8.3)

## 2015-02-26 LAB — CBC WITH DIFFERENTIAL/PLATELET
Basophils Absolute: 0 10*3/uL (ref 0.0–0.1)
Basophils Relative: 0.5 % (ref 0.0–3.0)
Eosinophils Absolute: 0.2 10*3/uL (ref 0.0–0.7)
Eosinophils Relative: 2.1 % (ref 0.0–5.0)
HEMATOCRIT: 46.4 % (ref 39.0–52.0)
Hemoglobin: 15.9 g/dL (ref 13.0–17.0)
Lymphocytes Relative: 32.4 % (ref 12.0–46.0)
Lymphs Abs: 3 10*3/uL (ref 0.7–4.0)
MCHC: 34.3 g/dL (ref 30.0–36.0)
MCV: 88.4 fl (ref 78.0–100.0)
MONOS PCT: 4.8 % (ref 3.0–12.0)
Monocytes Absolute: 0.4 10*3/uL (ref 0.1–1.0)
NEUTROS ABS: 5.5 10*3/uL (ref 1.4–7.7)
Neutrophils Relative %: 60.2 % (ref 43.0–77.0)
Platelets: 301 10*3/uL (ref 150.0–400.0)
RBC: 5.25 Mil/uL (ref 4.22–5.81)
RDW: 13.9 % (ref 11.5–15.5)
WBC: 9.2 10*3/uL (ref 4.0–10.5)

## 2015-02-26 LAB — URINALYSIS
BILIRUBIN URINE: NEGATIVE
Hgb urine dipstick: NEGATIVE
Ketones, ur: NEGATIVE
LEUKOCYTES UA: NEGATIVE
Nitrite: NEGATIVE
PH: 6 (ref 5.0–8.0)
Specific Gravity, Urine: <=1.005 — AB (ref 1.000–1.030)
Total Protein, Urine: NEGATIVE
Urine Glucose: NEGATIVE
Urobilinogen, UA: 0.2 (ref 0.0–1.0)

## 2015-02-26 LAB — BASIC METABOLIC PANEL
BUN: 15 mg/dL (ref 6–23)
CHLORIDE: 104 meq/L (ref 96–112)
CO2: 25 meq/L (ref 19–32)
Calcium: 10.5 mg/dL (ref 8.4–10.5)
Creatinine, Ser: 1.41 mg/dL (ref 0.40–1.50)
GFR: 59.87 mL/min — ABNORMAL LOW (ref >60.00)
GLUCOSE: 83 mg/dL (ref 70–99)
POTASSIUM: 4.4 meq/L (ref 3.5–5.1)
SODIUM: 140 meq/L (ref 135–145)

## 2015-02-26 LAB — LIPID PANEL
CHOLESTEROL: 206 mg/dL — AB (ref 0–200)
HDL: 44.3 mg/dL (ref >39.00)
LDL Cholesterol: 145 mg/dL — ABNORMAL HIGH (ref 0–99)
NonHDL: 161.7
Total CHOL/HDL Ratio: 5
Triglycerides: 86 mg/dL (ref 0.0–149.0)
VLDL: 17.2 mg/dL (ref 0.0–40.0)

## 2015-02-26 LAB — TSH: TSH: 3.41 u[IU]/mL (ref 0.35–4.50)

## 2015-02-26 LAB — TESTOSTERONE: Testosterone: 327.89 ng/dL (ref 300.00–890.00)

## 2015-02-26 LAB — VITAMIN B12: VITAMIN B 12: 284 pg/mL (ref 211–911)

## 2015-02-26 MED ORDER — ALPRAZOLAM 1 MG PO TABS: 1.0000 mg | ORAL_TABLET | Freq: Two times a day (BID) | ORAL | Status: DC | PRN

## 2015-02-26 NOTE — Progress Notes (Signed)
  Subjective:    HPI  The patient is here for a wellness exam. The patient has been doing well overall without major physical or psychological issues going on lately.  The patient needs to address  chronic hypertension that has been well controlled with medicines; to address chronic  hyperlipidemia controlled with medicines as well; and to address type 2 chronic diabetes, controlled with medical treatment and diet.  He had a colonoscopy done F/u HTN, obesity, anxiety - better Loosing wt on diet    Review of Systems  Constitutional: Negative for appetite change, fatigue and unexpected weight change.  HENT: Negative for congestion, nosebleeds, sneezing, sore throat and trouble swallowing.   Eyes: Negative for itching and visual disturbance.  Respiratory: Negative for cough.   Cardiovascular: Negative for chest pain, palpitations and leg swelling.  Gastrointestinal: Positive for abdominal pain (LUQ). Negative for nausea, vomiting, diarrhea, blood in stool and abdominal distention.  Genitourinary: Negative for frequency and hematuria.  Musculoskeletal: Negative for back pain, joint swelling, gait problem and neck pain.  Skin: Negative for rash.  Neurological: Negative for dizziness, tremors, speech difficulty and weakness.  Psychiatric/Behavioral: Negative for suicidal ideas, sleep disturbance, dysphoric mood and agitation. The patient is nervous/anxious.    BP Readings from Last 3 Encounters:  02/26/15 124/80  10/23/14 100/80  08/10/13 110/82   Wt Readings from Last 3 Encounters:  02/26/15 259 lb 8 oz (117.708 kg)  10/23/14 258 lb (117.028 kg)  08/10/13 216 lb (97.977 kg)        Objective:   Physical Exam  Constitutional: He is oriented to person, place, and time. He appears well-developed.  obese  HENT:  Mouth/Throat: Oropharynx is clear and moist.  Poor dentition  Eyes: Conjunctivae are normal. Pupils are equal, round, and reactive to light.  Neck: Normal range of  motion. No JVD present. No thyromegaly present.  Cardiovascular: Normal rate, regular rhythm, normal heart sounds and intact distal pulses.  Exam reveals no gallop and no friction rub.   No murmur heard. Pulmonary/Chest: Effort normal and breath sounds normal. No respiratory distress. He has no wheezes. He has no rales. He exhibits no tenderness.  Abdominal: Soft. Bowel sounds are normal. He exhibits no distension and no mass. There is no tenderness. There is no rebound and no guarding.  Musculoskeletal: Normal range of motion. He exhibits no edema or tenderness.  Lymphadenopathy:    He has no cervical adenopathy.  Neurological: He is alert and oriented to person, place, and time. He has normal reflexes. No cranial nerve deficit. He exhibits normal muscle tone. Coordination normal.  Skin: Skin is warm and dry. No rash noted.  Psychiatric: He has a normal mood and affect. His behavior is normal. Judgment and thought content normal.  Testic self exam ok - declined Teeth w/caries  Lab Results  Component Value Date   WBC 10.6* 08/09/2012   HGB 14.5 08/09/2012   HCT 44.3 08/09/2012   PLT 250.0 08/09/2012   GLUCOSE 83 08/10/2013   CHOL 245* 08/10/2013   TRIG 98.0 08/10/2013   HDL 45.10 08/10/2013   LDLDIRECT 191.0 08/10/2013   ALT 33 08/10/2013   AST 16 08/10/2013   NA 136 08/10/2013   K 4.1 08/10/2013   CL 104 08/10/2013   CREATININE 1.2 08/10/2013   BUN 12 08/10/2013   CO2 26 08/10/2013   TSH 4.82 08/09/2012           Assessment & Plan:

## 2015-02-26 NOTE — Assessment & Plan Note (Signed)
Pt was advised to see a dentist. Gum care adviced

## 2015-02-26 NOTE — Assessment & Plan Note (Signed)
We discussed age appropriate health related issues, including available/recomended screening tests and vaccinations. We discussed a need for adhering to healthy diet and exercise. Labs/EKG were reviewed/ordered. All questions were answered.   

## 2015-02-26 NOTE — Assessment & Plan Note (Signed)
Jacob Blackburn has cut back his food intake by 60 % Start exercising

## 2015-02-26 NOTE — Progress Notes (Signed)
Pre visit review using our clinic review tool, if applicable. No additional management support is needed unless otherwise documented below in the visit note. 

## 2015-02-28 ENCOUNTER — Other Ambulatory Visit: Payer: Self-pay | Admitting: *Deleted

## 2015-09-17 ENCOUNTER — Other Ambulatory Visit: Payer: Self-pay | Admitting: Internal Medicine

## 2015-09-18 NOTE — Telephone Encounter (Signed)
sch ov 

## 2015-11-13 ENCOUNTER — Other Ambulatory Visit: Payer: Self-pay | Admitting: Internal Medicine

## 2015-12-17 ENCOUNTER — Other Ambulatory Visit: Payer: Self-pay

## 2015-12-17 ENCOUNTER — Other Ambulatory Visit: Payer: Self-pay | Admitting: Internal Medicine

## 2015-12-17 NOTE — Telephone Encounter (Signed)
rf rq for alprazolam 1 mg to [facility] [place]. Please advise.

## 2015-12-20 MED ORDER — ALPRAZOLAM 1 MG PO TABS: 1.0000 mg | ORAL_TABLET | Freq: Two times a day (BID) | ORAL | Status: DC | PRN

## 2015-12-20 NOTE — Addendum Note (Signed)
Addended by: Verlan FriendsAIRRIKIER DAVIDSON, Ormond Lazo M on: 12/20/2015 05:26 PM   Modules accepted: Orders

## 2015-12-21 NOTE — Telephone Encounter (Signed)
rx faxed to pharmacy

## 2015-12-24 ENCOUNTER — Other Ambulatory Visit: Payer: Self-pay | Admitting: Internal Medicine

## 2015-12-25 ENCOUNTER — Other Ambulatory Visit: Payer: Self-pay

## 2015-12-25 ENCOUNTER — Telehealth: Payer: Self-pay

## 2015-12-25 NOTE — Telephone Encounter (Signed)
Pls call in Thx 

## 2015-12-25 NOTE — Telephone Encounter (Signed)
Please advise 

## 2015-12-25 NOTE — Telephone Encounter (Signed)
Pt called stating that his rx for xanax has not made it to the pharmacy. It was refill on 12/20/15. Please advise

## 2015-12-26 NOTE — Telephone Encounter (Signed)
Rf phoned in. Pt informed  

## 2016-02-26 ENCOUNTER — Other Ambulatory Visit: Payer: Self-pay | Admitting: Internal Medicine

## 2016-03-24 ENCOUNTER — Other Ambulatory Visit: Payer: Self-pay | Admitting: Internal Medicine

## 2016-03-25 ENCOUNTER — Telehealth: Payer: Self-pay

## 2016-03-25 NOTE — Telephone Encounter (Signed)
OK to fill this prescription with additional refills x0 Sch OV Thank you!  

## 2016-03-25 NOTE — Telephone Encounter (Signed)
Recd faxed rx refill request for xanax 1mg  tab from [facility] in [place]-----please advise, thanks

## 2016-03-26 ENCOUNTER — Other Ambulatory Visit: Payer: Self-pay

## 2016-03-26 MED ORDER — ALPRAZOLAM 1 MG PO TABS: 1.0000 mg | ORAL_TABLET | Freq: Two times a day (BID) | ORAL | Status: DC | PRN

## 2016-03-26 NOTE — Telephone Encounter (Signed)
Faxed to [facility]/[place]

## 2016-03-26 NOTE — Telephone Encounter (Addendum)
rx printed and waiting for plot to sign----patient has been advised to make OV for future refills---will call back to schedule OV

## 2016-04-30 ENCOUNTER — Encounter: Payer: Self-pay | Admitting: Internal Medicine

## 2016-04-30 ENCOUNTER — Ambulatory Visit (INDEPENDENT_AMBULATORY_CARE_PROVIDER_SITE_OTHER): Payer: 59 | Admitting: Internal Medicine

## 2016-04-30 ENCOUNTER — Other Ambulatory Visit (INDEPENDENT_AMBULATORY_CARE_PROVIDER_SITE_OTHER): Payer: 59

## 2016-04-30 VITALS — BP 110/74 | HR 109 | Ht 76.0 in | Wt 253.0 lb

## 2016-04-30 DIAGNOSIS — Z0000 Encounter for general adult medical examination without abnormal findings: Secondary | ICD-10-CM

## 2016-04-30 DIAGNOSIS — F32A Depression, unspecified: Secondary | ICD-10-CM

## 2016-04-30 DIAGNOSIS — F418 Other specified anxiety disorders: Secondary | ICD-10-CM | POA: Diagnosis not present

## 2016-04-30 DIAGNOSIS — Z23 Encounter for immunization: Secondary | ICD-10-CM

## 2016-04-30 DIAGNOSIS — I10 Essential (primary) hypertension: Secondary | ICD-10-CM

## 2016-04-30 DIAGNOSIS — F419 Anxiety disorder, unspecified: Principal | ICD-10-CM

## 2016-04-30 DIAGNOSIS — E669 Obesity, unspecified: Secondary | ICD-10-CM | POA: Diagnosis not present

## 2016-04-30 DIAGNOSIS — F329 Major depressive disorder, single episode, unspecified: Secondary | ICD-10-CM

## 2016-04-30 LAB — HEPATIC FUNCTION PANEL
ALBUMIN: 4.8 g/dL (ref 3.5–5.2)
ALK PHOS: 78 U/L (ref 39–117)
ALT: 23 U/L (ref 0–53)
AST: 15 U/L (ref 0–37)
BILIRUBIN DIRECT: 0.1 mg/dL (ref 0.0–0.3)
TOTAL PROTEIN: 7.6 g/dL (ref 6.0–8.3)
Total Bilirubin: 0.5 mg/dL (ref 0.2–1.2)

## 2016-04-30 LAB — LIPID PANEL
CHOL/HDL RATIO: 5
Cholesterol: 212 mg/dL — ABNORMAL HIGH (ref 0–200)
HDL: 44.5 mg/dL (ref >39.00)
LDL Cholesterol: 140 mg/dL — ABNORMAL HIGH (ref 0–99)
NONHDL: 167.77
TRIGLYCERIDES: 138 mg/dL (ref 0.0–149.0)
VLDL: 27.6 mg/dL (ref 0.0–40.0)

## 2016-04-30 LAB — TSH: TSH: 3.51 u[IU]/mL (ref 0.35–4.50)

## 2016-04-30 LAB — BASIC METABOLIC PANEL
BUN: 12 mg/dL (ref 6–23)
CHLORIDE: 102 meq/L (ref 96–112)
CO2: 31 meq/L (ref 19–32)
CREATININE: 1.42 mg/dL (ref 0.40–1.50)
Calcium: 9.8 mg/dL (ref 8.4–10.5)
GFR: 59.02 mL/min — ABNORMAL LOW (ref >60.00)
Glucose, Bld: 94 mg/dL (ref 70–99)
POTASSIUM: 4.2 meq/L (ref 3.5–5.1)
SODIUM: 140 meq/L (ref 135–145)

## 2016-04-30 LAB — CBC WITH DIFFERENTIAL/PLATELET
BASOS PCT: 0.5 % (ref 0.0–3.0)
Basophils Absolute: 0 10*3/uL (ref 0.0–0.1)
EOS ABS: 0.3 10*3/uL (ref 0.0–0.7)
EOS PCT: 3.2 % (ref 0.0–5.0)
HEMATOCRIT: 47 % (ref 39.0–52.0)
HEMOGLOBIN: 15.7 g/dL (ref 13.0–17.0)
LYMPHS PCT: 39.9 % (ref 12.0–46.0)
Lymphs Abs: 3.4 10*3/uL (ref 0.7–4.0)
MCHC: 33.4 g/dL (ref 30.0–36.0)
MCV: 90.6 fl (ref 78.0–100.0)
MONOS PCT: 8.6 % (ref 3.0–12.0)
Monocytes Absolute: 0.7 10*3/uL (ref 0.1–1.0)
NEUTROS ABS: 4 10*3/uL (ref 1.4–7.7)
Neutrophils Relative %: 47.8 % (ref 43.0–77.0)
Platelets: 302 10*3/uL (ref 150.0–400.0)
RBC: 5.19 Mil/uL (ref 4.22–5.81)
RDW: 13.8 % (ref 11.5–15.5)
WBC: 8.4 10*3/uL (ref 4.0–10.5)

## 2016-04-30 LAB — VITAMIN D 25 HYDROXY (VIT D DEFICIENCY, FRACTURES): VITD: 34.46 ng/mL (ref 30.00–100.00)

## 2016-04-30 LAB — VITAMIN B12: Vitamin B-12: 1482 pg/mL — ABNORMAL HIGH (ref 211–911)

## 2016-04-30 MED ORDER — VITAMIN D3 50 MCG (2000 UT) PO CAPS: 2000.0000 [IU] | ORAL_CAPSULE | Freq: Every day | ORAL | Status: AC

## 2016-04-30 MED ORDER — VITAMIN B-12 500 MCG SL SUBL: SUBLINGUAL_TABLET | SUBLINGUAL | Status: AC

## 2016-04-30 MED ORDER — OMEPRAZOLE 20 MG PO CPDR: 20.0000 mg | DELAYED_RELEASE_CAPSULE | Freq: Every day | ORAL | Status: DC

## 2016-04-30 MED ORDER — BUPROPION HCL ER (XL) 150 MG PO TB24: 150.0000 mg | ORAL_TABLET | Freq: Every day | ORAL | Status: DC

## 2016-04-30 MED ORDER — LOSARTAN POTASSIUM 100 MG PO TABS: 100.0000 mg | ORAL_TABLET | Freq: Every day | ORAL | Status: DC

## 2016-04-30 MED ORDER — ALPRAZOLAM 1 MG PO TABS: 1.0000 mg | ORAL_TABLET | Freq: Two times a day (BID) | ORAL | Status: DC | PRN

## 2016-04-30 NOTE — Assessment & Plan Note (Signed)
Xanax prn Wellbutrin  Potential benefits of a long term benzodiazepines  use as well as potential risks  and complications were explained to the patient and were aknowledged.

## 2016-04-30 NOTE — Assessment & Plan Note (Addendum)
BP Readings from Last 3 Encounters:  04/30/16 110/74  02/26/15 124/80  10/23/14 100/80  Losartan

## 2016-04-30 NOTE — Assessment & Plan Note (Signed)
We discussed age appropriate health related issues, including available/recomended screening tests and vaccinations. We discussed a need for adhering to healthy diet and exercise. Labs/EKG were reviewed/ordered. All questions were answered.   

## 2016-04-30 NOTE — Progress Notes (Signed)
Pre visit review using our clinic review tool, if applicable. No additional management support is needed unless otherwise documented below in the visit note. 

## 2016-04-30 NOTE — Assessment & Plan Note (Addendum)
Better  Wt Readings from Last 3 Encounters:  04/30/16 253 lb (114.76 kg)  02/26/15 259 lb 8 oz (117.708 kg)  10/23/14 258 lb (117.028 kg)

## 2016-04-30 NOTE — Addendum Note (Signed)
Addended by: Merrilyn PumaSIMMONS, Absalom Aro N on: 04/30/2016 04:50 PM   Modules accepted: Orders

## 2016-04-30 NOTE — Progress Notes (Signed)
Subjective:  Patient ID: Jacob Blackburn, male    DOB: 03/07/1976  Age: 40 y.o. MRN: [id number]  CC: Annual Exam   HPI Jacob Blackburn presents for a well exam F/u anxiety, elevated BP  Outpatient Prescriptions Prior to Visit  Medication Sig Dispense Refill  . ALPRAZolam (XANAX) 1 MG tablet Take 1 tablet (1 mg total) by mouth 2 (two) times daily as needed. 180 tablet 0  . buPROPion (WELLBUTRIN XL) 150 MG 24 hr tablet TAKE 1 TABLET BY MOUTH DAILY 90 tablet 0  . diphenhydrAMINE (BENADRYL) 25 MG tablet 1/2 tablet by mouth as needed (for sleep or nerves)    . Inulin (INULIN FIBER PREBIOTIC) 833.25 MG CAPS Take by mouth. 3 grams, 3/day    . losartan (COZAAR) 100 MG tablet Take 1 tablet (100 mg total) by mouth daily. Needs office visit. 90 tablet 0  . omeprazole (PRILOSEC) 20 MG capsule TAKE ONE CAPSULE BY MOUTH DAILY 30 capsule 0   No facility-administered medications prior to visit.    ROS Review of Systems  Constitutional: Negative for appetite change, fatigue and unexpected weight change.  HENT: Negative for congestion, nosebleeds, sneezing, sore throat and trouble swallowing.   Eyes: Negative for itching and visual disturbance.  Respiratory: Negative for cough.   Cardiovascular: Negative for chest pain, palpitations and leg swelling.  Gastrointestinal: Negative for nausea, diarrhea, blood in stool and abdominal distention.  Genitourinary: Negative for frequency and hematuria.  Musculoskeletal: Negative for back pain, joint swelling, gait problem and neck pain.  Skin: Negative for rash.  Neurological: Negative for dizziness, tremors, speech difficulty and weakness.  Psychiatric/Behavioral: Positive for decreased concentration. Negative for suicidal ideas, sleep disturbance, dysphoric mood and agitation. The patient is nervous/anxious.     Objective:  BP 110/74 mmHg  Pulse 109  Ht  (1.93 m)  Wt 253 lb (114.76 kg)  BMI 30.81 kg/m2  SpO2 95%  BP Readings from Last 3  Encounters:  04/30/16 110/74  02/26/15 124/80  10/23/14 100/80    Wt Readings from Last 3 Encounters:  04/30/16 253 lb (114.76 kg)  02/26/15 259 lb 8 oz (117.708 kg)  10/23/14 258 lb (117.028 kg)    Physical Exam  Constitutional: He is oriented to person, place, and time. He appears well-developed. No distress.  NAD  HENT:  Mouth/Throat: Oropharynx is clear and moist.  Eyes: Conjunctivae are normal. Pupils are equal, round, and reactive to light.  Neck: Normal range of motion. No JVD present. No thyromegaly present.  Cardiovascular: Normal rate, regular rhythm, normal heart sounds and intact distal pulses.  Exam reveals no gallop and no friction rub.   No murmur heard. Pulmonary/Chest: Effort normal and breath sounds normal. No respiratory distress. He has no wheezes. He has no rales. He exhibits no tenderness.  Abdominal: Soft. Bowel sounds are normal. He exhibits no distension and no mass. There is no tenderness. There is no rebound and no guarding.  Musculoskeletal: Normal range of motion. He exhibits no edema or tenderness.  Lymphadenopathy:    He has no cervical adenopathy.  Neurological: He is alert and oriented to person, place, and time. He has normal reflexes. No cranial nerve deficit. He exhibits normal muscle tone. He displays a negative Romberg sign. Coordination and gait normal.  Skin: Skin is warm and dry. No rash noted.  Psychiatric: His behavior is normal. Judgment and thought content normal.    Lab Results  Component Value Date   WBC 9.2 02/26/2015   HGB 15.9  02/26/2015   HCT 46.4 02/26/2015   PLT 301.0 02/26/2015   GLUCOSE 83 02/26/2015   CHOL 206* 02/26/2015   TRIG 86.0 02/26/2015   HDL 44.30 02/26/2015   LDLDIRECT 191.0 08/10/2013   LDLCALC 145* 02/26/2015   ALT 23 02/26/2015   AST 16 02/26/2015   NA 140 02/26/2015   K 4.4 02/26/2015   CL 104 02/26/2015   CREATININE 1.41 02/26/2015   BUN 15 02/26/2015   CO2 25 02/26/2015   TSH 3.41 02/26/2015     Ct Chest Limited W/o Cm  05/26/2003  FINDINGS CLINICAL DATA:    FOLLOW-UP RIGHT APICAL OPACITY. LIMITED CHEST CT WITHOUT CONTRAST LIMITED CHEST CT WAS PERFORMED OF THE UPPER CHEST TO EVALUATE AN AREA OF SOFT TISSUE IN THE MEDIAL RIGHT APEX. CT SCAN DEMONSTRATES AN AZYGOS VEIN AND AZYGOUS FISSURE.  THERE IS NO NODULE.  THE ADJACENT BONY STRUCTURES ARE NORMAL. IMPRESSION NO MASS DEMONSTRATED IN THE RIGHT APEX.  THE QUESTIONED AREA ON THE PRIOR CHEST X-RAY IS SECONDARY TO A NORMAL VARIANT ,AN  AZYGOUS VEIN AND FISSURE.   Assessment & Plan:   There are no diagnoses linked to this encounter. I am having Mr. Feria maintain his diphenhydrAMINE, Inulin, losartan, buPROPion, omeprazole, and ALPRAZolam.  No orders of the defined types were placed in this encounter.     Follow-up: No Follow-up on file.  Sonda PrimesAlex Plotnikov, MD

## 2016-07-25 ENCOUNTER — Telehealth: Payer: Self-pay | Admitting: *Deleted

## 2016-07-25 NOTE — Telephone Encounter (Signed)
Rec'd fax pt requesting refill on his Alprazolam. Last filled 06/26/16...Raechel Chute

## 2016-07-29 MED ORDER — ALPRAZOLAM 1 MG PO TABS: 1.0000 mg | ORAL_TABLET | Freq: Two times a day (BID) | ORAL | 1 refills | Status: DC | PRN

## 2016-07-29 NOTE — Telephone Encounter (Signed)
OK to fill this prescription with additional refills x1 Thank you!  

## 2016-07-29 NOTE — Telephone Encounter (Signed)
Called [facility] into [facility] spoke with Victorino Dike gave md approval.../lmb

## 2017-02-02 ENCOUNTER — Other Ambulatory Visit: Payer: Self-pay | Admitting: Internal Medicine

## 2017-02-03 NOTE — Telephone Encounter (Signed)
Needs OV.  

## 2017-02-04 NOTE — Telephone Encounter (Signed)
Called [facility]ill into [facility] had to leave on pharmacy vm left MD approval, also called pt no answer LMOM need to make appt fur future refills,,,,/LMB

## 2017-04-06 ENCOUNTER — Telehealth: Payer: Self-pay | Admitting: *Deleted

## 2017-04-06 NOTE — Telephone Encounter (Signed)
Rec'd fax pt requesting refills on his alprazolam 1 mg. Last filled 03/03/17...Raechel Chute

## 2017-04-06 NOTE — Telephone Encounter (Signed)
OK to fill this/these prescription(s) with additional refills x0 Needs to have an OV - well exam in May Thank you!

## 2017-04-07 MED ORDER — ALPRAZOLAM 1 MG PO TABS: 1.0000 mg | ORAL_TABLET | Freq: Two times a day (BID) | ORAL | 0 refills | Status: DC | PRN

## 2017-04-07 NOTE — Telephone Encounter (Signed)
Called [facility]ill into [facility] had to leave on pharmacy vm.../lmb 

## 2017-04-07 NOTE — Telephone Encounter (Signed)
Patient now has a cpe set up for May 7.

## 2017-05-04 ENCOUNTER — Encounter: Payer: Self-pay | Admitting: Internal Medicine

## 2017-05-04 ENCOUNTER — Ambulatory Visit (INDEPENDENT_AMBULATORY_CARE_PROVIDER_SITE_OTHER): Payer: 59 | Admitting: Internal Medicine

## 2017-05-04 ENCOUNTER — Other Ambulatory Visit (INDEPENDENT_AMBULATORY_CARE_PROVIDER_SITE_OTHER): Payer: 59

## 2017-05-04 VITALS — BP 118/78 | HR 91 | Temp 98.3°F | Ht 76.0 in | Wt 261.0 lb

## 2017-05-04 DIAGNOSIS — Z0000 Encounter for general adult medical examination without abnormal findings: Secondary | ICD-10-CM

## 2017-05-04 DIAGNOSIS — F419 Anxiety disorder, unspecified: Secondary | ICD-10-CM | POA: Diagnosis not present

## 2017-05-04 DIAGNOSIS — R5383 Other fatigue: Secondary | ICD-10-CM | POA: Diagnosis not present

## 2017-05-04 DIAGNOSIS — I10 Essential (primary) hypertension: Secondary | ICD-10-CM

## 2017-05-04 DIAGNOSIS — R1012 Left upper quadrant pain: Secondary | ICD-10-CM

## 2017-05-04 DIAGNOSIS — F329 Major depressive disorder, single episode, unspecified: Secondary | ICD-10-CM | POA: Diagnosis not present

## 2017-05-04 DIAGNOSIS — F32A Depression, unspecified: Secondary | ICD-10-CM

## 2017-05-04 LAB — URINALYSIS
Bilirubin Urine: NEGATIVE
Hgb urine dipstick: NEGATIVE
Ketones, ur: NEGATIVE
LEUKOCYTES UA: NEGATIVE
Nitrite: NEGATIVE
SPECIFIC GRAVITY, URINE: 1.025 (ref 1.000–1.030)
Total Protein, Urine: NEGATIVE
URINE GLUCOSE: NEGATIVE
UROBILINOGEN UA: 0.2 (ref 0.0–1.0)
pH: 5.5 (ref 5.0–8.0)

## 2017-05-04 LAB — CBC WITH DIFFERENTIAL/PLATELET
BASOS PCT: 1.1 % (ref 0.0–3.0)
Basophils Absolute: 0.1 10*3/uL (ref 0.0–0.1)
Eosinophils Absolute: 0.2 10*3/uL (ref 0.0–0.7)
Eosinophils Relative: 2.1 % (ref 0.0–5.0)
HEMATOCRIT: 44.6 % (ref 39.0–52.0)
Hemoglobin: 15 g/dL (ref 13.0–17.0)
LYMPHS ABS: 3.5 10*3/uL (ref 0.7–4.0)
Lymphocytes Relative: 34.2 % (ref 12.0–46.0)
MCHC: 33.5 g/dL (ref 30.0–36.0)
MCV: 91.8 fl (ref 78.0–100.0)
MONO ABS: 0.7 10*3/uL (ref 0.1–1.0)
Monocytes Relative: 6.9 % (ref 3.0–12.0)
NEUTROS ABS: 5.7 10*3/uL (ref 1.4–7.7)
Neutrophils Relative %: 55.7 % (ref 43.0–77.0)
PLATELETS: 299 10*3/uL (ref 150.0–400.0)
RBC: 4.86 Mil/uL (ref 4.22–5.81)
RDW: 13.9 % (ref 11.5–15.5)
WBC: 10.2 10*3/uL (ref 4.0–10.5)

## 2017-05-04 LAB — BASIC METABOLIC PANEL
BUN: 13 mg/dL (ref 6–23)
CHLORIDE: 104 meq/L (ref 96–112)
CO2: 27 meq/L (ref 19–32)
CREATININE: 1.31 mg/dL (ref 0.40–1.50)
Calcium: 9.5 mg/dL (ref 8.4–10.5)
GFR: 64.44 mL/min (ref >60.00)
GLUCOSE: 85 mg/dL (ref 70–99)
Potassium: 3.8 mEq/L (ref 3.5–5.1)
Sodium: 139 mEq/L (ref 135–145)

## 2017-05-04 LAB — LIPID PANEL
CHOL/HDL RATIO: 4
Cholesterol: 204 mg/dL — ABNORMAL HIGH (ref 0–200)
HDL: 51 mg/dL (ref >39.00)
LDL Cholesterol: 133 mg/dL — ABNORMAL HIGH (ref 0–99)
NonHDL: 153.46
TRIGLYCERIDES: 104 mg/dL (ref 0.0–149.0)
VLDL: 20.8 mg/dL (ref 0.0–40.0)

## 2017-05-04 LAB — HEPATIC FUNCTION PANEL
ALT: 39 U/L (ref 0–53)
AST: 20 U/L (ref 0–37)
Albumin: 4.7 g/dL (ref 3.5–5.2)
Alkaline Phosphatase: 69 U/L (ref 39–117)
Bilirubin, Direct: 0.1 mg/dL (ref 0.0–0.3)
TOTAL PROTEIN: 7.4 g/dL (ref 6.0–8.3)
Total Bilirubin: 0.5 mg/dL (ref 0.2–1.2)

## 2017-05-04 LAB — TSH: TSH: 3.42 u[IU]/mL (ref 0.35–4.50)

## 2017-05-04 MED ORDER — BUPROPION HCL ER (XL) 150 MG PO TB24: 150.0000 mg | ORAL_TABLET | Freq: Every day | ORAL | 11 refills | Status: DC

## 2017-05-04 MED ORDER — LOSARTAN POTASSIUM 100 MG PO TABS: 100.0000 mg | ORAL_TABLET | Freq: Every day | ORAL | 11 refills | Status: DC

## 2017-05-04 MED ORDER — OMEPRAZOLE 20 MG PO CPDR: 20.0000 mg | DELAYED_RELEASE_CAPSULE | Freq: Every day | ORAL | 11 refills | Status: DC

## 2017-05-04 MED ORDER — ALPRAZOLAM 1 MG PO TABS: 1.0000 mg | ORAL_TABLET | Freq: Two times a day (BID) | ORAL | 2 refills | Status: DC | PRN

## 2017-05-04 NOTE — Assessment & Plan Note (Signed)
Losartan 

## 2017-05-04 NOTE — Progress Notes (Signed)
Pre visit review using our clinic review tool, if applicable. No additional management support is needed unless otherwise documented below in the visit note. 

## 2017-05-04 NOTE — Progress Notes (Signed)
Subjective:  Patient ID: Jacob Blackburn, male    DOB: [birth year]  Age: 41 y.o. MRN: [id number]  CC: No chief complaint on file.   HPI Jacob Blackburn presents for well exam F/u anxiety C/o L abd pressure sensation x years  Outpatient Medications Prior to Visit  Medication Sig Dispense Refill  . ALPRAZolam (XANAX) 1 MG tablet Take 1 tablet (1 mg total) by mouth 2 (two) times daily as needed. 60 tablet 0  . buPROPion (WELLBUTRIN XL) 150 MG 24 hr tablet Take 1 tablet (150 mg total) by mouth daily. 90 tablet 3  . Cholecalciferol (VITAMIN D3) 2000 units capsule Take 1 capsule (2,000 Units total) by mouth daily. 100 capsule 3  . Cyanocobalamin (VITAMIN B-12) 500 MCG SUBL 1 sl qd 150 tablet 3  . diphenhydrAMINE (BENADRYL) 25 MG tablet 1/2 tablet by mouth as needed (for sleep or nerves)    . Inulin (INULIN FIBER PREBIOTIC) 833.25 MG CAPS Take by mouth. 3 grams, 3/day    . losartan (COZAAR) 100 MG tablet Take 1 tablet (100 mg total) by mouth daily. Needs office visit. 90 tablet 3  . omeprazole (PRILOSEC) 20 MG capsule Take 1 capsule (20 mg total) by mouth daily. 90 capsule 3   No facility-administered medications prior to visit.     ROS Review of Systems  Constitutional: Positive for unexpected weight change. Negative for appetite change and fatigue.  HENT: Negative for congestion, nosebleeds, sneezing, sore throat and trouble swallowing.   Eyes: Negative for itching and visual disturbance.  Respiratory: Negative for cough.   Cardiovascular: Negative for chest pain, palpitations and leg swelling.  Gastrointestinal: Positive for abdominal pain. Negative for abdominal distention, blood in stool, diarrhea and nausea.  Genitourinary: Negative for frequency and hematuria.  Musculoskeletal: Positive for arthralgias. Negative for back pain, gait problem, joint swelling and neck pain.  Skin: Negative for rash.  Neurological: Negative for dizziness, tremors, speech difficulty and weakness.    Psychiatric/Behavioral: Negative for agitation, dysphoric mood and sleep disturbance. The patient is nervous/anxious.     Objective:  BP 118/78 (BP Location: Left Arm, Patient Position: Sitting, Cuff Size: Large)   Pulse 91   Temp 98.3 F (36.8 C) (Oral)   Ht 6\' 4"  (1.93 m)   Wt 261 lb 0.6 oz (118.4 kg)   SpO2 98%   BMI 31.77 kg/m   BP Readings from Last 3 Encounters:  05/04/17 118/78  04/30/16 110/74  02/26/15 124/80    Wt Readings from Last 3 Encounters:  05/04/17 261 lb 0.6 oz (118.4 kg)  04/30/16 253 lb (114.8 kg)  02/26/15 259 lb 8 oz (117.7 kg)    Physical Exam  Constitutional: He is oriented to person, place, and time. He appears well-developed. No distress.  NAD  HENT:  Mouth/Throat: Oropharynx is clear and moist.  Eyes: Conjunctivae are normal. Pupils are equal, round, and reactive to light.  Neck: Normal range of motion. No JVD present. No thyromegaly present.  Cardiovascular: Normal rate, regular rhythm, normal heart sounds and intact distal pulses.  Exam reveals no gallop and no friction rub.   No murmur heard. Pulmonary/Chest: Effort normal and breath sounds normal. No respiratory distress. He has no wheezes. He has no rales. He exhibits no tenderness.  Abdominal: Soft. Bowel sounds are normal. He exhibits no distension and no mass. There is no tenderness. There is no rebound and no guarding.  Musculoskeletal: Normal range of motion. He exhibits no edema or tenderness.  Lymphadenopathy:  He has no cervical adenopathy.  Neurological: He is alert and oriented to person, place, and time. He has normal reflexes. No cranial nerve deficit. He exhibits normal muscle tone. He displays a negative Romberg sign. Coordination and gait normal.  Skin: Skin is warm and dry. No rash noted.  Psychiatric: He has a normal mood and affect. His behavior is normal. Judgment and thought content normal.  Obese LUQ is very tender locally  Lab Results  Component Value Date    WBC 8.4 04/30/2016   HGB 15.7 04/30/2016   HCT 47.0 04/30/2016   PLT 302.0 04/30/2016   GLUCOSE 94 04/30/2016   CHOL 212 (H) 04/30/2016   TRIG 138.0 04/30/2016   HDL 44.50 04/30/2016   LDLDIRECT 191.0 08/10/2013   LDLCALC 140 (H) 04/30/2016   ALT 23 04/30/2016   AST 15 04/30/2016   NA 140 04/30/2016   K 4.2 04/30/2016   CL 102 04/30/2016   CREATININE 1.42 04/30/2016   BUN 12 04/30/2016   CO2 31 04/30/2016   TSH 3.51 04/30/2016    Ct Chest Limited W/o Cm  Result Date: 05/26/2003 FINDINGS CLINICAL DATA:    FOLLOW-UP RIGHT APICAL OPACITY. LIMITED CHEST CT WITHOUT CONTRAST LIMITED CHEST CT WAS PERFORMED OF THE UPPER CHEST TO EVALUATE AN AREA OF SOFT TISSUE IN THE MEDIAL RIGHT APEX. CT SCAN DEMONSTRATES AN AZYGOS VEIN AND AZYGOUS FISSURE.  THERE IS NO NODULE.  THE ADJACENT BONY STRUCTURES ARE NORMAL. IMPRESSION NO MASS DEMONSTRATED IN THE RIGHT APEX.  THE QUESTIONED AREA ON THE PRIOR CHEST X-RAY IS SECONDARY TO A NORMAL VARIANT ,AN  AZYGOUS VEIN AND FISSURE.   Assessment & Plan:   There are no diagnoses linked to this encounter. I am having Mr. Minetti maintain his diphenhydrAMINE, Inulin, buPROPion, losartan, omeprazole, Vitamin B-12, Vitamin D3, ALPRAZolam, and Magnesium.  Meds ordered this encounter  Medications  . Magnesium 500 MG TABS    Sig: Take by mouth.     Follow-up: No Follow-up on file.  Sonda PrimesAlex Suriya Kovarik, MD

## 2017-05-04 NOTE — Assessment & Plan Note (Signed)
We discussed age appropriate health related issues, including available/recomended screening tests and vaccinations. We discussed a need for adhering to healthy diet and exercise. Labs/EKG were reviewed/ordered. All questions were answered.   

## 2017-05-04 NOTE — Assessment & Plan Note (Signed)
Xanax prn Wellbutrin  Potential benefits of a long term benzodiazepines  use as well as potential risks  and complications were explained to the patient and were aknowledged.

## 2017-05-04 NOTE — Assessment & Plan Note (Signed)
Worse Will order CT Labs

## 2017-05-18 ENCOUNTER — Ambulatory Visit (INDEPENDENT_AMBULATORY_CARE_PROVIDER_SITE_OTHER)
Admission: RE | Admit: 2017-05-18 | Discharge: 2017-05-18 | Disposition: A | Discharge status: Home / Self Care | Payer: 59 | Source: Ambulatory Visit | Attending: Internal Medicine | Admitting: Internal Medicine

## 2017-05-18 DIAGNOSIS — R1012 Left upper quadrant pain: Secondary | ICD-10-CM | POA: Diagnosis not present

## 2017-05-18 DIAGNOSIS — R10812 Left upper quadrant abdominal tenderness: Secondary | ICD-10-CM | POA: Diagnosis not present

## 2017-05-18 MED ORDER — IOPAMIDOL (ISOVUE-300) INJECTION 61%
100.0000 mL | Freq: Once | INTRAVENOUS | Status: AC | PRN
  Administered 2017-05-18: 100 mL via INTRAVENOUS

## 2017-08-11 ENCOUNTER — Other Ambulatory Visit: Payer: Self-pay | Admitting: Internal Medicine

## 2017-08-12 NOTE — Telephone Encounter (Signed)
Please advise, okey for refill?

## 2017-08-12 NOTE — Telephone Encounter (Signed)
Refill is still pending per office policy 24-48 hours turn around time. MD received script yesterday 8/14. Waiting on approval.../lmb

## 2017-08-12 NOTE — Telephone Encounter (Signed)
Pt called checking on this prescription refill.

## 2017-08-13 NOTE — Telephone Encounter (Signed)
Patient is following up on this rx. Patient was not aware of the turn around time. He is going out of town tomorrow. Please advise.

## 2017-08-14 NOTE — Telephone Encounter (Signed)
Patient has called back in regard.  Please follow up at [phone].  Patient states he was on his way out of town but pharmacy has not got script.  Patient is waiting on medication to leave.

## 2017-08-15 NOTE — Telephone Encounter (Signed)
OK to fill this/these prescription(s) with additional refills x2 Needs to have an OV every 6 months Thank you!  

## 2017-08-17 NOTE — Telephone Encounter (Signed)
[facility]ed [facility] left on pharmacy vm w/MD authorization...Raechel Chute

## 2017-11-18 ENCOUNTER — Other Ambulatory Visit: Payer: Self-pay | Admitting: Internal Medicine

## 2017-11-18 NOTE — Telephone Encounter (Signed)
RX loaded, please advise 

## 2017-11-18 NOTE — Telephone Encounter (Signed)
Pt called and states he called his XANAX in Monday, he would like to get this refill today so he can pick it up on black Friday  Please advise

## 2017-11-22 MED ORDER — ALPRAZOLAM 1 MG PO TABS: 1.0000 mg | ORAL_TABLET | Freq: Two times a day (BID) | ORAL | 0 refills | Status: DC | PRN

## 2017-12-25 ENCOUNTER — Telehealth: Payer: Self-pay | Admitting: Internal Medicine

## 2017-12-25 NOTE — Telephone Encounter (Signed)
LOV with Dr. Posey ReaPlotnikov / Next appointment 01/05/2018 / Is provider will to give an antibiotic to patient prior to his visit.  See patients request below.

## 2017-12-25 NOTE — Telephone Encounter (Signed)
Copied from CRM [id number]. Topic: Quick Communication - See Telephone Encounter >> Dec 25, 2017  8:39 AM Jolayne Hainesaylor, Brittany L wrote: CRM for notification. See Telephone encounter for:   12/25/17.  Pt states he has had epidiymis before and he feels like he is getting it again. He would like a antibotic called in for him if possible. He has OV on 1/9 he said he could wait until then but would like something to help him until then. [facility] [address]

## 2017-12-27 NOTE — Telephone Encounter (Signed)
He needs to see a provider to get checked Thx

## 2017-12-28 ENCOUNTER — Ambulatory Visit: Payer: 59 | Admitting: Medical

## 2017-12-28 NOTE — Telephone Encounter (Signed)
Called pt n answer LMOM w/MD response,,/lmb

## 2018-01-01 ENCOUNTER — Encounter: Payer: Self-pay | Admitting: Internal Medicine

## 2018-01-01 ENCOUNTER — Other Ambulatory Visit (INDEPENDENT_AMBULATORY_CARE_PROVIDER_SITE_OTHER): Payer: 59

## 2018-01-01 ENCOUNTER — Ambulatory Visit: Payer: 59 | Admitting: Internal Medicine

## 2018-01-01 ENCOUNTER — Ambulatory Visit
Admission: RE | Admit: 2018-01-01 | Discharge: 2018-01-01 | Disposition: A | Discharge status: Home / Self Care | Payer: 59 | Source: Ambulatory Visit | Attending: Internal Medicine | Admitting: Internal Medicine

## 2018-01-01 VITALS — BP 138/92 | HR 91 | Temp 98.6°F | Ht 76.0 in | Wt 268.0 lb

## 2018-01-01 DIAGNOSIS — F419 Anxiety disorder, unspecified: Secondary | ICD-10-CM

## 2018-01-01 DIAGNOSIS — I10 Essential (primary) hypertension: Secondary | ICD-10-CM

## 2018-01-01 DIAGNOSIS — N503 Cyst of epididymis: Secondary | ICD-10-CM | POA: Diagnosis not present

## 2018-01-01 DIAGNOSIS — F329 Major depressive disorder, single episode, unspecified: Secondary | ICD-10-CM | POA: Diagnosis not present

## 2018-01-01 DIAGNOSIS — N50819 Testicular pain, unspecified: Secondary | ICD-10-CM

## 2018-01-01 DIAGNOSIS — F32A Depression, unspecified: Secondary | ICD-10-CM

## 2018-01-01 LAB — CBC WITH DIFFERENTIAL/PLATELET
BASOS PCT: 0.8 % (ref 0.0–3.0)
Basophils Absolute: 0.1 10*3/uL (ref 0.0–0.1)
EOS PCT: 3.1 % (ref 0.0–5.0)
Eosinophils Absolute: 0.3 10*3/uL (ref 0.0–0.7)
HEMATOCRIT: 46.1 % (ref 39.0–52.0)
HEMOGLOBIN: 15.3 g/dL (ref 13.0–17.0)
LYMPHS PCT: 36.4 % (ref 12.0–46.0)
Lymphs Abs: 3.1 10*3/uL (ref 0.7–4.0)
MCHC: 33.2 g/dL (ref 30.0–36.0)
MCV: 94 fl (ref 78.0–100.0)
Monocytes Absolute: 0.6 10*3/uL (ref 0.1–1.0)
Monocytes Relative: 7.1 % (ref 3.0–12.0)
NEUTROS ABS: 4.5 10*3/uL (ref 1.4–7.7)
Neutrophils Relative %: 52.6 % (ref 43.0–77.0)
PLATELETS: 294 10*3/uL (ref 150.0–400.0)
RBC: 4.9 Mil/uL (ref 4.22–5.81)
RDW: 13.6 % (ref 11.5–15.5)
WBC: 8.6 10*3/uL (ref 4.0–10.5)

## 2018-01-01 LAB — HEPATIC FUNCTION PANEL
ALBUMIN: 4.7 g/dL (ref 3.5–5.2)
ALK PHOS: 73 U/L (ref 39–117)
ALT: 30 U/L (ref 0–53)
AST: 18 U/L (ref 0–37)
BILIRUBIN DIRECT: 0.2 mg/dL (ref 0.0–0.3)
TOTAL PROTEIN: 7.5 g/dL (ref 6.0–8.3)
Total Bilirubin: 0.8 mg/dL (ref 0.2–1.2)

## 2018-01-01 LAB — BASIC METABOLIC PANEL
BUN: 14 mg/dL (ref 6–23)
CALCIUM: 9.6 mg/dL (ref 8.4–10.5)
CO2: 27 mEq/L (ref 19–32)
CREATININE: 1.3 mg/dL (ref 0.40–1.50)
Chloride: 101 mEq/L (ref 96–112)
GFR: 64.79 mL/min (ref >60.00)
GLUCOSE: 81 mg/dL (ref 70–99)
POTASSIUM: 4 meq/L (ref 3.5–5.1)
Sodium: 139 mEq/L (ref 135–145)

## 2018-01-01 LAB — VITAMIN B12: Vitamin B-12: 1321 pg/mL — ABNORMAL HIGH (ref 211–911)

## 2018-01-01 LAB — URINALYSIS
Bilirubin Urine: NEGATIVE
Hgb urine dipstick: NEGATIVE
LEUKOCYTES UA: NEGATIVE
Nitrite: NEGATIVE
PH: 6 (ref 5.0–8.0)
SPECIFIC GRAVITY, URINE: 1.02 (ref 1.000–1.030)
Total Protein, Urine: NEGATIVE
URINE GLUCOSE: NEGATIVE
Urobilinogen, UA: 0.2 (ref 0.0–1.0)

## 2018-01-01 LAB — SEDIMENTATION RATE: Sed Rate: 28 mm/hr — ABNORMAL HIGH (ref 0–15)

## 2018-01-01 LAB — TSH: TSH: 3.36 u[IU]/mL (ref 0.35–4.50)

## 2018-01-01 MED ORDER — ALPRAZOLAM 1 MG PO TABS: 1.0000 mg | ORAL_TABLET | Freq: Two times a day (BID) | ORAL | 3 refills | Status: DC | PRN

## 2018-01-01 MED ORDER — OMEPRAZOLE 20 MG PO CPDR: 20.0000 mg | DELAYED_RELEASE_CAPSULE | Freq: Every day | ORAL | 11 refills | Status: DC

## 2018-01-01 MED ORDER — DOXYCYCLINE HYCLATE 100 MG PO TABS: 100.0000 mg | ORAL_TABLET | Freq: Two times a day (BID) | ORAL | 0 refills | Status: DC

## 2018-01-01 MED ORDER — LOSARTAN POTASSIUM 100 MG PO TABS: 100.0000 mg | ORAL_TABLET | Freq: Every day | ORAL | 11 refills | Status: DC

## 2018-01-01 MED ORDER — BUPROPION HCL ER (XL) 150 MG PO TB24: 150.0000 mg | ORAL_TABLET | Freq: Every day | ORAL | 11 refills | Status: DC

## 2018-01-01 NOTE — Assessment & Plan Note (Signed)
Xanax prn Wellbutrin  Potential benefits of a long term benzodiazepines  use as well as potential risks  and complications were explained to the patient and were aknowledged.

## 2018-01-01 NOTE — Assessment & Plan Note (Signed)
STAT US Cipro po

## 2018-01-01 NOTE — Assessment & Plan Note (Signed)
Losartan 

## 2018-01-01 NOTE — Progress Notes (Signed)
Subjective:  Patient ID: Jacob Blackburn, male    DOB: 11/17/1976  Age: 42 y.o. MRN: [id number]  CC: No chief complaint on file.   HPI Jacob Blackburn presents for R testis pain x 2 weeks - worse H/o epididymitis x3. No STDs. Pain is 5-6/10. Doxy helped in the past...  Outpatient Medications Prior to Visit  Medication Sig Dispense Refill  . ALPRAZolam (XANAX) 1 MG tablet Take 1 tablet (1 mg total) by mouth 2 (two) times daily as needed. Patient needs office visit before refills will be given 60 tablet 0  . buPROPion (WELLBUTRIN XL) 150 MG 24 hr tablet Take 1 tablet (150 mg total) by mouth daily. 30 tablet 11  . Cholecalciferol (VITAMIN D3) 2000 units capsule Take 1 capsule (2,000 Units total) by mouth daily. 100 capsule 3  . Cyanocobalamin (VITAMIN B-12) 500 MCG SUBL 1 sl qd 150 tablet 3  . diphenhydrAMINE (BENADRYL) 25 MG tablet 1/2 tablet by mouth as needed (for sleep or nerves)    . Inulin (INULIN FIBER PREBIOTIC) 833.25 MG CAPS Take by mouth. 3 grams, 3/day    . losartan (COZAAR) 100 MG tablet Take 1 tablet (100 mg total) by mouth daily. Needs office visit. 30 tablet 11  . Magnesium 500 MG TABS Take by mouth.    Marland Kitchen omeprazole (PRILOSEC) 20 MG capsule Take 1 capsule (20 mg total) by mouth daily. 30 capsule 11   No facility-administered medications prior to visit.     ROS Review of Systems  Constitutional: Negative for appetite change, fatigue and unexpected weight change.  HENT: Negative for congestion, nosebleeds, sneezing, sore throat and trouble swallowing.   Eyes: Negative for itching and visual disturbance.  Respiratory: Negative for cough.   Cardiovascular: Negative for chest pain, palpitations and leg swelling.  Gastrointestinal: Negative for abdominal distention, blood in stool, diarrhea and nausea.  Genitourinary: Positive for testicular pain. Negative for frequency, hematuria, penile swelling, scrotal swelling and urgency.  Musculoskeletal: Negative for back pain, gait  problem, joint swelling and neck pain.  Skin: Negative for rash.  Neurological: Negative for dizziness, tremors, speech difficulty and weakness.  Psychiatric/Behavioral: Negative for agitation, dysphoric mood and sleep disturbance. The patient is nervous/anxious.     Objective:  BP (!) 138/92 (BP Location: Left Arm, Patient Position: Sitting, Cuff Size: Large)   Pulse 91   Temp 98.6 F (37 C) (Oral)   Ht 6\' 4"  (1.93 m)   Wt 268 lb (121.6 kg)   SpO2 98%   BMI 32.62 kg/m   BP Readings from Last 3 Encounters:  01/01/18 (!) 138/92  05/04/17 118/78  04/30/16 110/74    Wt Readings from Last 3 Encounters:  01/01/18 268 lb (121.6 kg)  05/04/17 261 lb 0.6 oz (118.4 kg)  04/30/16 253 lb (114.8 kg)    Physical Exam  Constitutional: He is oriented to person, place, and time. He appears well-developed. No distress.  NAD  HENT:  Mouth/Throat: Oropharynx is clear and moist.  Eyes: Conjunctivae are normal. Pupils are equal, round, and reactive to light.  Neck: Normal range of motion. No JVD present. No thyromegaly present.  Cardiovascular: Normal rate, regular rhythm, normal heart sounds and intact distal pulses. Exam reveals no gallop and no friction rub.  No murmur heard. Pulmonary/Chest: Effort normal and breath sounds normal. No respiratory distress. He has no wheezes. He has no rales. He exhibits no tenderness.  Abdominal: Soft. Bowel sounds are normal. He exhibits no distension and no mass. There is no  tenderness. There is no rebound and no guarding.  Genitourinary: Penis normal. No penile tenderness.  Musculoskeletal: Normal range of motion. He exhibits no edema or tenderness.  Lymphadenopathy:    He has no cervical adenopathy.  Neurological: He is alert and oriented to person, place, and time. He has normal reflexes. No cranial nerve deficit. He exhibits normal muscle tone. He displays a negative Romberg sign. Coordination and gait normal.  Skin: Skin is warm and dry. No rash  noted.  Psychiatric: He has a normal mood and affect. His behavior is normal. Judgment and thought content normal.   L testis is NT R testis is withdrawn - I am not able to reach it due to pain  Lab Results  Component Value Date   WBC 10.2 05/04/2017   HGB 15.0 05/04/2017   HCT 44.6 05/04/2017   PLT 299.0 05/04/2017   GLUCOSE 85 05/04/2017   CHOL 204 (H) 05/04/2017   TRIG 104.0 05/04/2017   HDL 51.00 05/04/2017   LDLDIRECT 191.0 08/10/2013   LDLCALC 133 (H) 05/04/2017   ALT 39 05/04/2017   AST 20 05/04/2017   NA 139 05/04/2017   K 3.8 05/04/2017   CL 104 05/04/2017   CREATININE 1.31 05/04/2017   BUN 13 05/04/2017   CO2 27 05/04/2017   TSH 3.42 05/04/2017    Ct Abdomen Pelvis W Contrast  Result Date: 05/18/2017 CLINICAL DATA:  Left upper quadrant tenderness EXAM: CT ABDOMEN AND PELVIS WITH CONTRAST TECHNIQUE: Multidetector CT imaging of the abdomen and pelvis was performed using the standard protocol following bolus administration of intravenous contrast. CONTRAST:  100mL ISOVUE-300 IOPAMIDOL (ISOVUE-300) INJECTION 61% COMPARISON:  None. FINDINGS: Lower chest: Lung bases are clear. Hepatobiliary: Liver is within normal limits. Gallbladder is unremarkable. No intrahepatic or extrahepatic ductal dilatation. Pancreas: Within normal limits. Spleen: Within normal limits. Adrenals/Urinary Tract: Adrenal glands are within normal limits. Kidneys are within normal limits.  No hydronephrosis. Bladder is within normal limits. Stomach/Bowel: Stomach is within normal limits. 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 No evidence of bowel obstruction. Normal appendix (series 2/ image 66). Vascular/Lymphatic: No evidence of abdominal aortic aneurysm. No suspicious abdominopelvic lymphadenopathy. Reproductive: Prostate is unremarkable. Other: No abdominopelvic ascites. No evidence of ventral or inguinal hernia. Musculoskeletal: Visualized osseous  structures are within normal limits. IMPRESSION: Intestinal nonrotation. No evidence of bowel obstruction. Normal appendix. Otherwise unremarkable CT abdomen/pelvis. No evidence of ventral or inguinal hernia. Electronically Signed   By: Charline BillsSriyesh  Krishnan M.D.   On: 05/18/2017 14:11    Assessment & Plan:   There are no diagnoses linked to this encounter. I am having Artist Paisolin W. Mcquarrie maintain his diphenhydrAMINE, Inulin, Vitamin B-12, Vitamin D3, Magnesium, omeprazole, losartan, buPROPion, and ALPRAZolam.  No orders of the defined types were placed in this encounter.    Follow-up: No Follow-up on file.  Sonda PrimesAlex Kareema Keitt, MD

## 2018-01-05 ENCOUNTER — Ambulatory Visit: Payer: 59 | Admitting: Internal Medicine

## 2018-01-18 ENCOUNTER — Ambulatory Visit: Payer: 59 | Admitting: Internal Medicine

## 2018-05-13 ENCOUNTER — Other Ambulatory Visit: Payer: Self-pay | Admitting: Internal Medicine

## 2018-05-13 NOTE — Telephone Encounter (Signed)
Refill request for Xanax , last refilled on 01/01/18 #60 with 3 refills  LOV: 01/01/18 Dr. Posey Rea  [facility] in [place]

## 2018-05-13 NOTE — Telephone Encounter (Signed)
Copied from CRM [id number]. Topic: Quick Communication - Rx Refill/Question >> May 13, 2018  5:07 PM Mickel Baas B, [place] wrote: Medication: ALPRAZolam Prudy Feeler) 1 MG tablet  Has the patient contacted their pharmacy? Yes.   (Agent: If no, request that the patient contact the pharmacy for the refill.) Preferred Pharmacy (with phone number or street name): [id number] - [place], [place] - [address] Agent: Please be advised that RX refills may take up to 3 business days. We ask that you follow-up with your pharmacy.  Patient has scheduled his medication refill appointment with Dr Roderic Scarce for 06/10/18 at 8:30am.

## 2018-05-18 MED ORDER — ALPRAZOLAM 1 MG PO TABS: 1.0000 mg | ORAL_TABLET | Freq: Two times a day (BID) | ORAL | 0 refills | Status: DC | PRN

## 2018-06-10 ENCOUNTER — Ambulatory Visit: Payer: 59 | Admitting: Internal Medicine

## 2018-06-10 ENCOUNTER — Encounter: Payer: Self-pay | Admitting: Internal Medicine

## 2018-06-10 DIAGNOSIS — E6609 Other obesity due to excess calories: Secondary | ICD-10-CM | POA: Diagnosis not present

## 2018-06-10 DIAGNOSIS — F329 Major depressive disorder, single episode, unspecified: Secondary | ICD-10-CM

## 2018-06-10 DIAGNOSIS — Z6832 Body mass index (BMI) 32.0-32.9, adult: Secondary | ICD-10-CM | POA: Diagnosis not present

## 2018-06-10 DIAGNOSIS — K219 Gastro-esophageal reflux disease without esophagitis: Secondary | ICD-10-CM

## 2018-06-10 DIAGNOSIS — F419 Anxiety disorder, unspecified: Secondary | ICD-10-CM | POA: Diagnosis not present

## 2018-06-10 DIAGNOSIS — I10 Essential (primary) hypertension: Secondary | ICD-10-CM

## 2018-06-10 DIAGNOSIS — F32A Depression, unspecified: Secondary | ICD-10-CM

## 2018-06-10 MED ORDER — OMEPRAZOLE 20 MG PO CPDR: 20.0000 mg | DELAYED_RELEASE_CAPSULE | Freq: Every day | ORAL | 11 refills | Status: DC

## 2018-06-10 MED ORDER — ALPRAZOLAM 1 MG PO TABS: 1.0000 mg | ORAL_TABLET | Freq: Two times a day (BID) | ORAL | 3 refills | Status: DC | PRN

## 2018-06-10 MED ORDER — LOSARTAN POTASSIUM 100 MG PO TABS
100.0000 mg | ORAL_TABLET | Freq: Every day | ORAL | 11 refills | Status: DC
Start: 2018-06-10 — End: 2018-11-08

## 2018-06-10 MED ORDER — BUPROPION HCL ER (XL) 150 MG PO TB24: 150.0000 mg | ORAL_TABLET | Freq: Every day | ORAL | 11 refills | Status: DC

## 2018-06-10 NOTE — Assessment & Plan Note (Signed)
Losartan 

## 2018-06-10 NOTE — Assessment & Plan Note (Signed)
Omeprazole

## 2018-06-10 NOTE — Progress Notes (Signed)
Subjective:  Patient ID: Jacob Blackburn, male    DOB: 08/10/1976  Age: 42 y.o. MRN: [id number]  CC: No chief complaint on file.   HPI Jacob Blackburn presents for anxiety, B12 def, depression f/u  Outpatient Medications Prior to Visit  Medication Sig Dispense Refill  . ALPRAZolam (XANAX) 1 MG tablet Take 1 tablet (1 mg total) by mouth 2 (two) times daily as needed for anxiety. Patient needs office visit before refills will be given 60 tablet 0  . buPROPion (WELLBUTRIN XL) 150 MG 24 hr tablet Take 1 tablet (150 mg total) by mouth daily. 30 tablet 11  . Cholecalciferol (VITAMIN D3) 2000 units capsule Take 1 capsule (2,000 Units total) by mouth daily. 100 capsule 3  . Cyanocobalamin (VITAMIN B-12) 500 MCG SUBL 1 sl qd 150 tablet 3  . diphenhydrAMINE (BENADRYL) 25 MG tablet 1/2 tablet by mouth as needed (for sleep or nerves)    . doxycycline (VIBRA-TABS) 100 MG tablet Take 1 tablet (100 mg total) by mouth 2 (two) times daily. 28 tablet 0  . Inulin (INULIN FIBER PREBIOTIC) 833.25 MG CAPS Take by mouth. 3 grams, 3/day    . losartan (COZAAR) 100 MG tablet Take 1 tablet (100 mg total) by mouth daily. Needs office visit. 30 tablet 11  . Magnesium 500 MG TABS Take by mouth.    Marland Kitchen. omeprazole (PRILOSEC) 20 MG capsule Take 1 capsule (20 mg total) by mouth daily. 30 capsule 11   No facility-administered medications prior to visit.     ROS: Review of Systems  Constitutional: Positive for unexpected weight change. Negative for appetite change and fatigue.  HENT: Negative for congestion, nosebleeds, sneezing, sore throat and trouble swallowing.   Eyes: Negative for itching and visual disturbance.  Respiratory: Negative for cough.   Cardiovascular: Negative for chest pain, palpitations and leg swelling.  Gastrointestinal: Negative for abdominal distention, blood in stool, diarrhea and nausea.  Genitourinary: Negative for frequency and hematuria.  Musculoskeletal: Negative for back pain, gait  problem, joint swelling and neck pain.  Skin: Negative for rash.  Neurological: Negative for dizziness, tremors, speech difficulty and weakness.  Psychiatric/Behavioral: Positive for dysphoric mood. Negative for agitation and sleep disturbance. The patient is nervous/anxious.     Objective:  BP 132/82 (BP Location: Left Arm, Patient Position: Sitting, Cuff Size: Large)   Pulse 92   Temp 98.4 F (36.9 C) (Oral)   Ht 6\' 4"  (1.93 m)   Wt 277 lb (125.6 kg)   SpO2 96%   BMI 33.72 kg/m   BP Readings from Last 3 Encounters:  06/10/18 132/82  01/01/18 (!) 138/92  05/04/17 118/78    Wt Readings from Last 3 Encounters:  06/10/18 277 lb (125.6 kg)  01/01/18 268 lb (121.6 kg)  05/04/17 261 lb 0.6 oz (118.4 kg)    Physical Exam  Constitutional: He is oriented to person, place, and time. He appears well-developed. No distress.  NAD  HENT:  Mouth/Throat: Oropharynx is clear and moist.  Eyes: Pupils are equal, round, and reactive to light. Conjunctivae are normal.  Neck: Normal range of motion. No JVD present. No thyromegaly present.  Cardiovascular: Normal rate, regular rhythm, normal heart sounds and intact distal pulses. Exam reveals no gallop and no friction rub.  No murmur heard. Pulmonary/Chest: Effort normal and breath sounds normal. No respiratory distress. He has no wheezes. He has no rales. He exhibits no tenderness.  Abdominal: Soft. Bowel sounds are normal. He exhibits no distension and no mass. There  is no tenderness. There is no rebound and no guarding.  Musculoskeletal: Normal range of motion. He exhibits no edema or tenderness.  Lymphadenopathy:    He has no cervical adenopathy.  Neurological: He is alert and oriented to person, place, and time. He has normal reflexes. No cranial nerve deficit. He exhibits normal muscle tone. He displays a negative Romberg sign. Coordination and gait normal.  Skin: Skin is warm and dry. No rash noted.  Psychiatric: He has a normal mood  and affect. His behavior is normal. Judgment and thought content normal.  obese  Lab Results  Component Value Date   WBC 8.6 01/01/2018   HGB 15.3 01/01/2018   HCT 46.1 01/01/2018   PLT 294.0 01/01/2018   GLUCOSE 81 01/01/2018   CHOL 204 (H) 05/04/2017   TRIG 104.0 05/04/2017   HDL 51.00 05/04/2017   LDLDIRECT 191.0 08/10/2013   LDLCALC 133 (H) 05/04/2017   ALT 30 01/01/2018   AST 18 01/01/2018   NA 139 01/01/2018   K 4.0 01/01/2018   CL 101 01/01/2018   CREATININE 1.30 01/01/2018   BUN 14 01/01/2018   CO2 27 01/01/2018   TSH 3.36 01/01/2018    [place] Scrotom W/doppler  Result Date: 01/01/2018 CLINICAL DATA:  Right testicle pain. EXAM: SCROTAL ULTRASOUND DOPPLER ULTRASOUND OF THE TESTICLES TECHNIQUE: Complete ultrasound examination of the testicles, epididymis, and other scrotal structures was performed. Color and spectral Doppler ultrasound were also utilized to evaluate blood flow to the testicles. COMPARISON:  CT 05/18/2017. FINDINGS: Right testicle Measurements: 4.5 x 1.7 x 3.1 cm. No mass or microlithiasis visualized. Left testicle Measurements: 4.2 x 1.7 x 3.0 cm. No mass or microlithiasis visualized. Right epididymis:  Tiny epididymal cysts. Left epididymis:  Tiny epididymal cyst. Hydrocele:  None visualized. Varicocele:  None visualized. Pulsed Doppler interrogation of both testes demonstrates normal low resistance arterial and venous waveforms bilaterally. IMPRESSION: Tiny bilateral epididymal cysts. Exam otherwise unremarkable. No evidence of testicular mass or torsion. Electronically Signed   By: Maisie Fus  Register   On: 01/01/2018 13:48    Assessment & Plan:   There are no diagnoses linked to this encounter.   No orders of the defined types were placed in this encounter.    Follow-up: No follow-ups on file.  Sonda Primes, MD

## 2018-06-10 NOTE — Assessment & Plan Note (Addendum)
Worse - discussed Ref to Dr Dalbert GarnetBeasley

## 2018-06-10 NOTE — Assessment & Plan Note (Signed)
Xanax prn Wellbutrin  Potential benefits of a long term benzodiazepines  use as well as potential risks  and complications were explained to the patient and were aknowledged. 

## 2018-11-04 ENCOUNTER — Other Ambulatory Visit: Payer: Self-pay | Admitting: Internal Medicine

## 2018-11-04 NOTE — Telephone Encounter (Signed)
Copied from CRM [id number]. Topic: Quick Communication - Rx Refill/Question >> Nov 04, 2018  8:32 AM Gerrianne Scale wrote: Medication: ALPRAZolam Prudy Feeler) 1 MG tablet  Until his appt on Monday he will run  out of medicine today  Has the patient contacted their pharmacy? Yes  (Agent: If no, request that the patient contact the pharmacy for the refill.) (Agent: If yes, when and what did the pharmacy advise?)  Preferred Pharmacy (with phone number or street name):     [id number] - [place], [place] - [address] OF [address] [phone] (Phone) [phone] (Fax)    Agent: Please be advised that RX refills may take up to 3 business days. We ask that you follow-up with your pharmacy.

## 2018-11-04 NOTE — Telephone Encounter (Signed)
Check [place] registry last filled 09/22/2018../[email or web address]  

## 2018-11-04 NOTE — Telephone Encounter (Signed)
Requested medication (s) are due for refill today: yes  Requested medication (s) are on the active medication list: yes    Last refill: 06/10/18   #60  3 refills  Future visit scheduled yes 11/08/18 Dr. Posey Rea  Notes to clinic:not delegated   Pt requests partial refill until his appt 11/08/18. States will run out.  Requested Prescriptions  Pending Prescriptions Disp Refills   ALPRAZolam (XANAX) 1 MG tablet 60 tablet 3    Sig: Take 1 tablet (1 mg total) by mouth 2 (two) times daily as needed for anxiety. Patient needs office visit before refills will be given     Not Delegated - Psychiatry:  Anxiolytics/Hypnotics Failed - 11/04/2018  8:43 AM      Failed - This refill cannot be delegated      Failed - Urine Drug Screen completed in last 360 days.      Passed - Valid encounter within last 6 months    Recent Outpatient Visits          4 months ago Class 1 obesity due to excess calories without serious comorbidity with body mass index (BMI) of 32.0 to 32.9 in adult   [facility] Plot[facility], Georgina Quint, MD   10 months ago Pain in testis   [facility], Georgina Quint, MD   1 year ago Well adult exam   [facility] Plot[facility], Georgina Quint, MD   2 years ago Well adult exam   [facility] Plot[facility], Georgina Quint, MD   3 years ago Well adult exam   [facility], Georgina Quint, MD      Future Appointments            In 4 days Plotnikov, Georgina Quint, MD [facility], [place]

## 2018-11-05 MED ORDER — ALPRAZOLAM 1 MG PO TABS: 1.0000 mg | ORAL_TABLET | Freq: Two times a day (BID) | ORAL | 3 refills | Status: DC | PRN

## 2018-11-08 ENCOUNTER — Other Ambulatory Visit (INDEPENDENT_AMBULATORY_CARE_PROVIDER_SITE_OTHER): Payer: 59

## 2018-11-08 ENCOUNTER — Encounter: Payer: Self-pay | Admitting: Internal Medicine

## 2018-11-08 ENCOUNTER — Ambulatory Visit: Payer: 59 | Admitting: Internal Medicine

## 2018-11-08 VITALS — BP 134/76 | HR 98 | Temp 98.1°F | Ht 76.0 in | Wt 273.0 lb

## 2018-11-08 DIAGNOSIS — F329 Major depressive disorder, single episode, unspecified: Secondary | ICD-10-CM | POA: Diagnosis not present

## 2018-11-08 DIAGNOSIS — Z23 Encounter for immunization: Secondary | ICD-10-CM

## 2018-11-08 DIAGNOSIS — F419 Anxiety disorder, unspecified: Secondary | ICD-10-CM

## 2018-11-08 DIAGNOSIS — F32A Depression, unspecified: Secondary | ICD-10-CM

## 2018-11-08 DIAGNOSIS — K219 Gastro-esophageal reflux disease without esophagitis: Secondary | ICD-10-CM

## 2018-11-08 DIAGNOSIS — I10 Essential (primary) hypertension: Secondary | ICD-10-CM

## 2018-11-08 LAB — CBC WITH DIFFERENTIAL/PLATELET
BASOS PCT: 1.1 % (ref 0.0–3.0)
Basophils Absolute: 0.1 10*3/uL (ref 0.0–0.1)
EOS ABS: 0.3 10*3/uL (ref 0.0–0.7)
Eosinophils Relative: 3.6 % (ref 0.0–5.0)
HEMATOCRIT: 45.2 % (ref 39.0–52.0)
Hemoglobin: 15.2 g/dL (ref 13.0–17.0)
LYMPHS ABS: 2.8 10*3/uL (ref 0.7–4.0)
LYMPHS PCT: 31.2 % (ref 12.0–46.0)
MCHC: 33.8 g/dL (ref 30.0–36.0)
MCV: 93 fl (ref 78.0–100.0)
Monocytes Absolute: 0.8 10*3/uL (ref 0.1–1.0)
Monocytes Relative: 9.3 % (ref 3.0–12.0)
NEUTROS ABS: 4.8 10*3/uL (ref 1.4–7.7)
NEUTROS PCT: 54.8 % (ref 43.0–77.0)
PLATELETS: 299 10*3/uL (ref 150.0–400.0)
RBC: 4.86 Mil/uL (ref 4.22–5.81)
RDW: 13.9 % (ref 11.5–15.5)
WBC: 8.8 10*3/uL (ref 4.0–10.5)

## 2018-11-08 LAB — BASIC METABOLIC PANEL
BUN: 23 mg/dL (ref 6–23)
CALCIUM: 9.5 mg/dL (ref 8.4–10.5)
CHLORIDE: 106 meq/L (ref 96–112)
CO2: 26 meq/L (ref 19–32)
CREATININE: 1.34 mg/dL (ref 0.40–1.50)
GFR: 62.3 mL/min (ref >60.00)
GLUCOSE: 74 mg/dL (ref 70–99)
Potassium: 3.9 mEq/L (ref 3.5–5.1)
Sodium: 141 mEq/L (ref 135–145)

## 2018-11-08 LAB — TSH: TSH: 5.51 u[IU]/mL — ABNORMAL HIGH (ref 0.35–4.50)

## 2018-11-08 MED ORDER — BUPROPION HCL ER (XL) 150 MG PO TB24: 150.0000 mg | ORAL_TABLET | Freq: Every day | ORAL | 11 refills | Status: DC

## 2018-11-08 MED ORDER — LOSARTAN POTASSIUM 100 MG PO TABS: 100.0000 mg | ORAL_TABLET | Freq: Every day | ORAL | 11 refills | Status: DC

## 2018-11-08 MED ORDER — OMEPRAZOLE 20 MG PO CPDR: 20.0000 mg | DELAYED_RELEASE_CAPSULE | Freq: Every day | ORAL | 11 refills | Status: DC

## 2018-11-08 MED ORDER — ALPRAZOLAM 1 MG PO TABS: 1.0000 mg | ORAL_TABLET | Freq: Two times a day (BID) | ORAL | 3 refills | Status: DC | PRN

## 2018-11-08 NOTE — Assessment & Plan Note (Signed)
Xanax prn Wellbutrin  Potential benefits of a long term benzodiazepines  use as well as potential risks  and complications were explained to the patient and were aknowledged. 

## 2018-11-08 NOTE — Assessment & Plan Note (Signed)
Discussed.

## 2018-11-08 NOTE — Progress Notes (Signed)
Subjective:  Patient ID: Jacob Blackburn, male    DOB: [birth year]  Age: 42 y.o. MRN: [id number]  CC: No chief complaint on file.   HPI TACOMA MERIDA presents for anxiety, depression Working nights - a lot  Outpatient Medications Prior to Visit  Medication Sig Dispense Refill  . ALPRAZolam (XANAX) 1 MG tablet Take 1 tablet (1 mg total) by mouth 2 (two) times daily as needed for anxiety. Patient needs office visit before refills will be given 60 tablet 3  . buPROPion (WELLBUTRIN XL) 150 MG 24 hr tablet Take 1 tablet (150 mg total) by mouth daily. 30 tablet 11  . Cholecalciferol (VITAMIN D3) 2000 units capsule Take 1 capsule (2,000 Units total) by mouth daily. 100 capsule 3  . Cyanocobalamin (VITAMIN B-12) 500 MCG SUBL 1 sl qd 150 tablet 3  . diphenhydrAMINE (BENADRYL) 25 MG tablet 1/2 tablet by mouth as needed (for sleep or nerves)    . Inulin (INULIN FIBER PREBIOTIC) 833.25 MG CAPS Take by mouth. 3 grams, 3/day    . losartan (COZAAR) 100 MG tablet Take 1 tablet (100 mg total) by mouth daily. Needs office visit. 30 tablet 11  . Magnesium 500 MG TABS Take by mouth.    Marland Kitchen omeprazole (PRILOSEC) 20 MG capsule Take 1 capsule (20 mg total) by mouth daily. 30 capsule 11   No facility-administered medications prior to visit.     ROS: Review of Systems  Constitutional: Negative for appetite change, fatigue and unexpected weight change.  HENT: Negative for congestion, nosebleeds, sneezing, sore throat and trouble swallowing.   Eyes: Negative for itching and visual disturbance.  Respiratory: Negative for cough.   Cardiovascular: Negative for chest pain, palpitations and leg swelling.  Gastrointestinal: Negative for abdominal distention, blood in stool, diarrhea and nausea.  Genitourinary: Negative for frequency and hematuria.  Musculoskeletal: Negative for back pain, gait problem, joint swelling and neck pain.  Skin: Negative for rash.  Neurological: Negative for dizziness, tremors, speech  difficulty and weakness.  Psychiatric/Behavioral: Positive for sleep disturbance. Negative for agitation, dysphoric mood and suicidal ideas. The patient is nervous/anxious.     Objective:  BP 134/76 (BP Location: Right Arm, Patient Position: Sitting, Cuff Size: Large)   Pulse 98   Temp 98.1 F (36.7 C) (Oral)   Ht 6\' 4"  (1.93 m)   Wt 273 lb (123.8 kg)   SpO2 97%   BMI 33.23 kg/m   BP Readings from Last 3 Encounters:  11/08/18 134/76  06/10/18 132/82  01/01/18 (!) 138/92    Wt Readings from Last 3 Encounters:  11/08/18 273 lb (123.8 kg)  06/10/18 277 lb (125.6 kg)  01/01/18 268 lb (121.6 kg)    Physical Exam  Constitutional: He is oriented to person, place, and time. He appears well-developed. No distress.  NAD  HENT:  Mouth/Throat: Oropharynx is clear and moist.  Eyes: Pupils are equal, round, and reactive to light. Conjunctivae are normal.  Neck: Normal range of motion. No JVD present. No thyromegaly present.  Cardiovascular: Normal rate, regular rhythm, normal heart sounds and intact distal pulses. Exam reveals no gallop and no friction rub.  No murmur heard. Pulmonary/Chest: Effort normal and breath sounds normal. No respiratory distress. He has no wheezes. He has no rales. He exhibits no tenderness.  Abdominal: Soft. Bowel sounds are normal. He exhibits no distension and no mass. There is no tenderness. There is no rebound and no guarding.  Musculoskeletal: Normal range of motion. He exhibits no edema or tenderness.  Lymphadenopathy:    He has no cervical adenopathy.  Neurological: He is alert and oriented to person, place, and time. He has normal reflexes. No cranial nerve deficit. He exhibits normal muscle tone. He displays a negative Romberg sign. Coordination and gait normal.  Skin: Skin is warm and dry. No rash noted.  Psychiatric: He has a normal mood and affect. His behavior is normal. Judgment and thought content normal.  obese Caries, gingivitis  Lab  Results  Component Value Date   WBC 8.6 01/01/2018   HGB 15.3 01/01/2018   HCT 46.1 01/01/2018   PLT 294.0 01/01/2018   GLUCOSE 81 01/01/2018   CHOL 204 (H) 05/04/2017   TRIG 104.0 05/04/2017   HDL 51.00 05/04/2017   LDLDIRECT 191.0 08/10/2013   LDLCALC 133 (H) 05/04/2017   ALT 30 01/01/2018   AST 18 01/01/2018   NA 139 01/01/2018   K 4.0 01/01/2018   CL 101 01/01/2018   CREATININE 1.30 01/01/2018   BUN 14 01/01/2018   CO2 27 01/01/2018   TSH 3.36 01/01/2018    [place] Scrotom W/doppler  Result Date: 01/01/2018 CLINICAL DATA:  Right testicle pain. EXAM: SCROTAL ULTRASOUND DOPPLER ULTRASOUND OF THE TESTICLES TECHNIQUE: Complete ultrasound examination of the testicles, epididymis, and other scrotal structures was performed. Color and spectral Doppler ultrasound were also utilized to evaluate blood flow to the testicles. COMPARISON:  CT 05/18/2017. FINDINGS: Right testicle Measurements: 4.5 x 1.7 x 3.1 cm. No mass or microlithiasis visualized. Left testicle Measurements: 4.2 x 1.7 x 3.0 cm. No mass or microlithiasis visualized. Right epididymis:  Tiny epididymal cysts. Left epididymis:  Tiny epididymal cyst. Hydrocele:  None visualized. Varicocele:  None visualized. Pulsed Doppler interrogation of both testes demonstrates normal low resistance arterial and venous waveforms bilaterally. IMPRESSION: Tiny bilateral epididymal cysts. Exam otherwise unremarkable. No evidence of testicular mass or torsion. Electronically Signed   By: Maisie Fus  Register   On: 01/01/2018 13:48    Assessment & Plan:   There are no diagnoses linked to this encounter.   No orders of the defined types were placed in this encounter.    Follow-up: No follow-ups on file.  Sonda Primes, MD

## 2018-11-08 NOTE — Assessment & Plan Note (Signed)
Losartan 

## 2018-11-09 ENCOUNTER — Other Ambulatory Visit: Payer: Self-pay | Admitting: Internal Medicine

## 2018-11-09 DIAGNOSIS — R5383 Other fatigue: Secondary | ICD-10-CM

## 2019-03-10 ENCOUNTER — Other Ambulatory Visit: Payer: Self-pay | Admitting: Internal Medicine

## 2019-06-13 ENCOUNTER — Other Ambulatory Visit: Payer: Self-pay | Admitting: Internal Medicine

## 2019-08-16 ENCOUNTER — Other Ambulatory Visit: Payer: Self-pay | Admitting: Internal Medicine

## 2019-08-16 NOTE — Telephone Encounter (Signed)
[place] Controlled Database Checked Last filled: 07/15/19 # 60 LOV w/you: 11/08/18 Next appt w/you: None

## 2019-10-12 ENCOUNTER — Other Ambulatory Visit: Payer: Self-pay | Admitting: Internal Medicine

## 2019-11-10 ENCOUNTER — Other Ambulatory Visit: Payer: Self-pay | Admitting: Internal Medicine

## 2019-12-08 ENCOUNTER — Other Ambulatory Visit: Payer: Self-pay | Admitting: Internal Medicine

## 2020-01-18 ENCOUNTER — Other Ambulatory Visit: Payer: Self-pay | Admitting: Internal Medicine

## 2020-02-23 ENCOUNTER — Other Ambulatory Visit: Payer: Self-pay | Admitting: Internal Medicine

## 2020-04-30 ENCOUNTER — Telehealth: Payer: Self-pay | Admitting: Internal Medicine

## 2020-04-30 NOTE — Telephone Encounter (Signed)
Pt has appointment tomorrow, will fill at appt

## 2020-04-30 NOTE — Telephone Encounter (Signed)
New message:   1.Medication Requested: losartan (COZAAR) 100 MG tablet ALPRAZolam (XANAX) 1 MG tablet omeprazole (PRILOSEC) 20 MG capsule 2. Pharmacy (Name, Street, [place]): [facility] [phone] - [place], [place] - [address] 3. On Med List: Yes  4. Last Visit with PCP: 11/08/18  5. Next visit date with PCP:    Agent: Please be advised that RX refills may take up to 3 business days. We ask that you follow-up with your pharmacy.

## 2020-05-01 ENCOUNTER — Encounter: Payer: Self-pay | Admitting: Internal Medicine

## 2020-05-01 ENCOUNTER — Telehealth (INDEPENDENT_AMBULATORY_CARE_PROVIDER_SITE_OTHER): Payer: 59 | Admitting: Internal Medicine

## 2020-05-01 DIAGNOSIS — I10 Essential (primary) hypertension: Secondary | ICD-10-CM

## 2020-05-01 DIAGNOSIS — F329 Major depressive disorder, single episode, unspecified: Secondary | ICD-10-CM | POA: Diagnosis not present

## 2020-05-01 DIAGNOSIS — F419 Anxiety disorder, unspecified: Secondary | ICD-10-CM

## 2020-05-01 DIAGNOSIS — F32A Depression, unspecified: Secondary | ICD-10-CM

## 2020-05-01 DIAGNOSIS — K219 Gastro-esophageal reflux disease without esophagitis: Secondary | ICD-10-CM | POA: Diagnosis not present

## 2020-05-01 MED ORDER — ALPRAZOLAM 1 MG PO TABS: ORAL_TABLET | ORAL | 1 refills | Status: DC

## 2020-05-01 MED ORDER — OMEPRAZOLE 20 MG PO CPDR: DELAYED_RELEASE_CAPSULE | ORAL | 3 refills | Status: DC

## 2020-05-01 MED ORDER — LOSARTAN POTASSIUM 100 MG PO TABS: 100.0000 mg | ORAL_TABLET | Freq: Every day | ORAL | 3 refills | Status: DC

## 2020-05-01 NOTE — Progress Notes (Signed)
Virtual Visit via Video Note  I connected with Jacob Blackburn on 05/01/20 at  8:50 AM EDT by a video enabled telemedicine application and verified that I am speaking with the correct person using two identifiers.   I discussed the limitations of evaluation and management by telemedicine and the availability of in person appointments. The patient expressed understanding and agreed to proceed.  History of Present Illness: Jacob Blackburn presents for HTN, GERD, anxiety Pt stopped Wellbutrin - doing ok Working too much  There has been no runny nose, cough, chest pain, shortness of breath, abdominal pain, diarrhea, constipation, arthralgias, skin rashes.   Observations/Objective: The patient appears to be in no acute distress, looks ok  Assessment and Plan:  See my Assessment and Plan. Follow Up Instructions:    I discussed the assessment and treatment plan with the patient. The patient was provided an opportunity to ask questions and all were answered. The patient agreed with the plan and demonstrated an understanding of the instructions.   The patient was advised to call back or seek an in-person evaluation if the symptoms worsen or if the condition fails to improve as anticipated.  I provided face-to-face time during this encounter. We were at different locations.   Sonda Primes, MD

## 2020-05-01 NOTE — Assessment & Plan Note (Signed)
Pt stopped Wellbutrin - doing ok Working too much On Xanax

## 2020-05-01 NOTE — Assessment & Plan Note (Signed)
Losartan 

## 2020-05-01 NOTE — Assessment & Plan Note (Signed)
Prilosec 

## 2021-01-29 ENCOUNTER — Other Ambulatory Visit: Payer: Self-pay | Admitting: Internal Medicine

## 2021-04-28 ENCOUNTER — Other Ambulatory Visit: Payer: Self-pay | Admitting: Internal Medicine

## 2021-04-29 ENCOUNTER — Ambulatory Visit (INDEPENDENT_AMBULATORY_CARE_PROVIDER_SITE_OTHER): Payer: BC Managed Care – PPO

## 2021-04-29 ENCOUNTER — Encounter: Payer: Self-pay | Admitting: Internal Medicine

## 2021-04-29 ENCOUNTER — Other Ambulatory Visit: Payer: Self-pay

## 2021-04-29 ENCOUNTER — Ambulatory Visit: Payer: BLUE CROSS/BLUE SHIELD | Admitting: Internal Medicine

## 2021-04-29 ENCOUNTER — Other Ambulatory Visit: Payer: Self-pay | Admitting: Internal Medicine

## 2021-04-29 VITALS — BP 132/80 | HR 115 | Temp 99.1°F | Resp 18 | Ht 76.0 in | Wt 287.2 lb

## 2021-04-29 DIAGNOSIS — M79641 Pain in right hand: Secondary | ICD-10-CM | POA: Diagnosis not present

## 2021-04-29 NOTE — Progress Notes (Signed)
   Subjective:   Patient ID: Jacob Blackburn, male    DOB: [birth year], 45 y.o.   MRN: [id number]  HPI The patient is a 45 YO man coming in for concerns about right hand pain and swelling. Started about 1-2 weeks ago. Originally he was elevating the hand and this helped with the swelling. He does work 3rd shift and swelling is worse when getting up. 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 Does have pain generalized in the hand/wrist without focal area pain. Denies bruising or skin color change. Overall worsening. Has tried otc pain medication with relief of pain but not swelling.   Review of Systems  Constitutional: Negative.   HENT: Negative.   Eyes: Negative.   Respiratory: Negative for cough, chest tightness and shortness of breath.   Cardiovascular: Negative for chest pain, palpitations and leg swelling.  Gastrointestinal: Negative for abdominal distention, abdominal pain, constipation, diarrhea, nausea and vomiting.  Musculoskeletal: Positive for joint swelling and myalgias.  Skin: Negative.   Neurological: Negative.   Psychiatric/Behavioral: Negative.     Objective:  Physical Exam Constitutional:      Appearance: He is well-developed.  HENT:     Head: Normocephalic and atraumatic.  Cardiovascular:     Rate and Rhythm: Normal rate and regular rhythm.  Pulmonary:     Effort: Pulmonary effort is normal. No respiratory distress.     Breath sounds: Normal breath sounds. No wheezing or rales.  Abdominal:     General: Bowel sounds are normal. There is no distension.     Palpations: Abdomen is soft.     Tenderness: There is no abdominal tenderness. There is no rebound.  Musculoskeletal:     Cervical back: Normal range of motion.     Comments: Minimal swelling and tenderness right hand, no localizing pain although more on the ulnar aspect.   Skin:    General: Skin is warm and dry.  Neurological:     Mental Status:  He is alert and oriented to person, place, and time.     Coordination: Coordination normal.     Vitals:   04/29/21 0823  BP: 132/80  Pulse: (!) 115  Resp: 18  Temp: 99.1 F (37.3 C)  TempSrc: Oral  SpO2: 95%  Weight: 287 lb 3.2 oz (130.3 kg)  Height: 6\' 4"  (1.93 m)    This visit occurred during the SARS-CoV-2 public health emergency.  Safety protocols were in place, including screening questions prior to the visit, additional usage of staff PPE, and extensive cleaning of exam room while observing appropriate contact time as indicated for disinfecting solutions.   Assessment & Plan:

## 2021-04-29 NOTE — Patient Instructions (Signed)
We will get the x-ray of the hand and go from there.

## 2021-04-29 NOTE — Assessment & Plan Note (Signed)
Ordered x-ray wrist and hand and will go from there. Pain controlled with otc for now.

## 2021-04-30 ENCOUNTER — Telehealth: Payer: Self-pay | Admitting: Internal Medicine

## 2021-04-30 NOTE — Telephone Encounter (Signed)
See result note.  

## 2021-04-30 NOTE — Telephone Encounter (Signed)
Patient is requesting a call back in regards to his recent x-ray results. He can be reached at [phone]. Please advise

## 2021-06-05 ENCOUNTER — Other Ambulatory Visit: Payer: Self-pay | Admitting: Internal Medicine

## 2021-06-13 ENCOUNTER — Other Ambulatory Visit: Payer: Self-pay | Admitting: Internal Medicine

## 2021-07-17 ENCOUNTER — Other Ambulatory Visit: Payer: Self-pay | Admitting: Internal Medicine

## 2022-05-17 IMAGING — DX DG WRIST COMPLETE 3+V*R*
4 series · 4 of 4 positions shown · non-contrast
Comparison: None.

CLINICAL DATA: 44-year-old male with right hand swelling.

EXAM:
RIGHT WRIST - COMPLETE 3+ VIEW; RIGHT HAND - COMPLETE 3+ VIEW

[wrist ap]
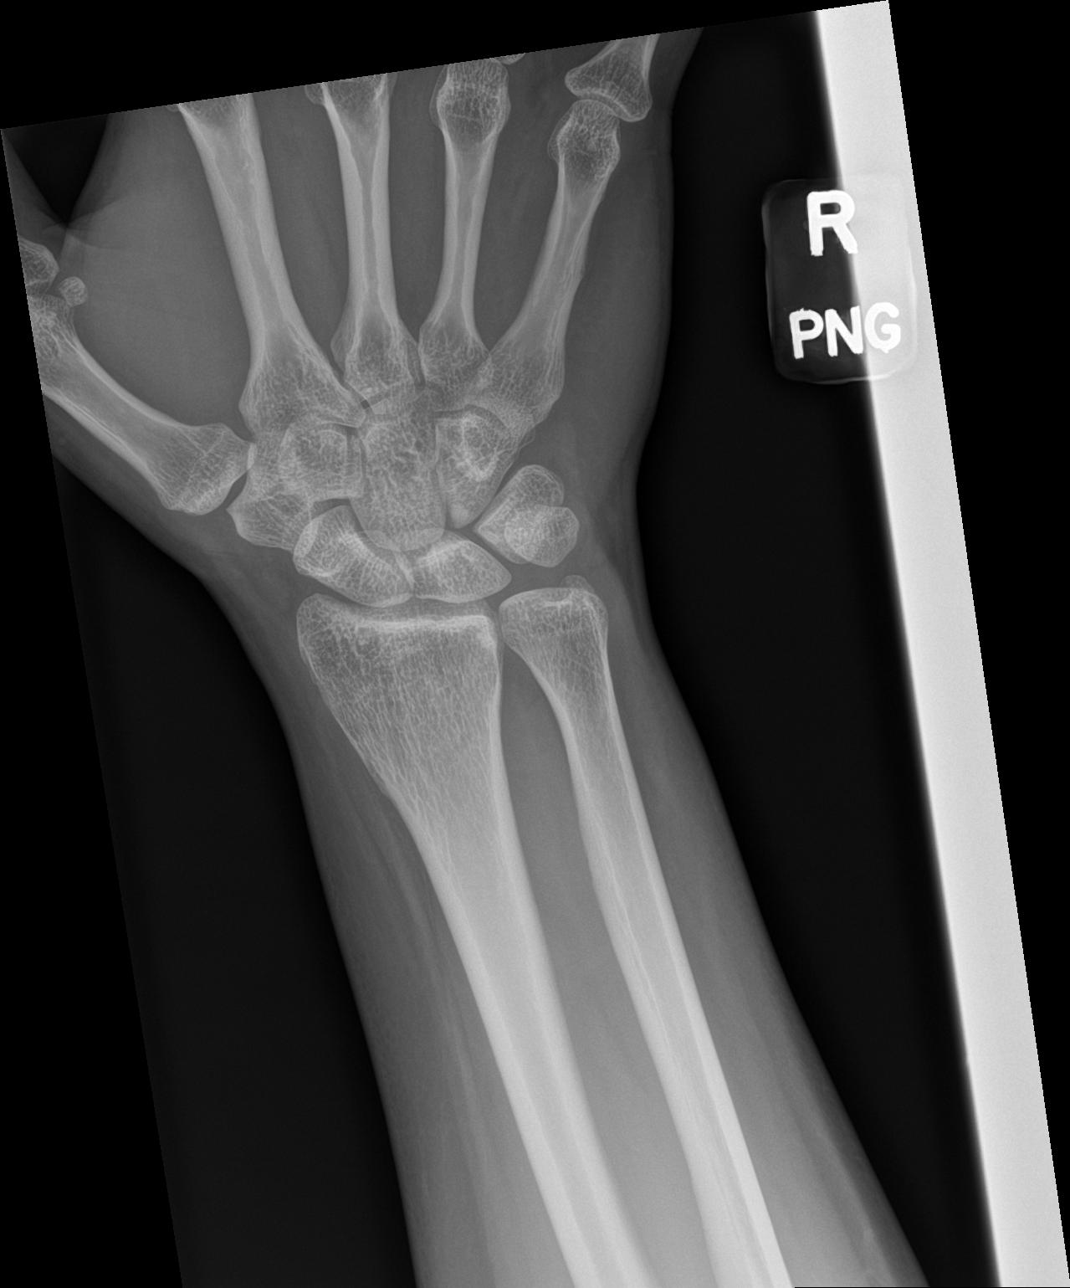

[wrist obl]
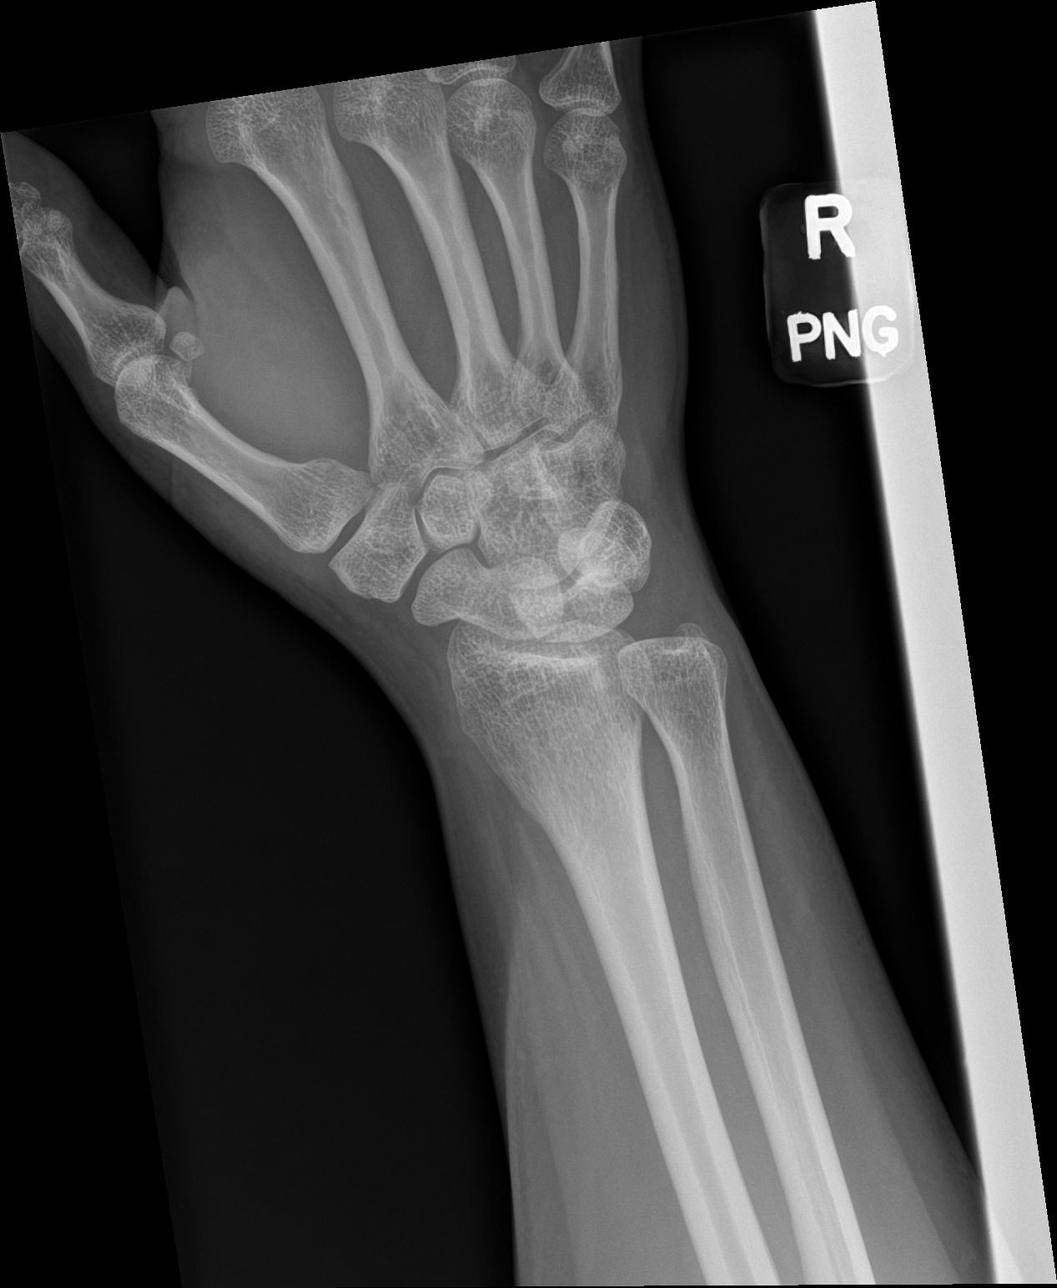

[wrist lat]
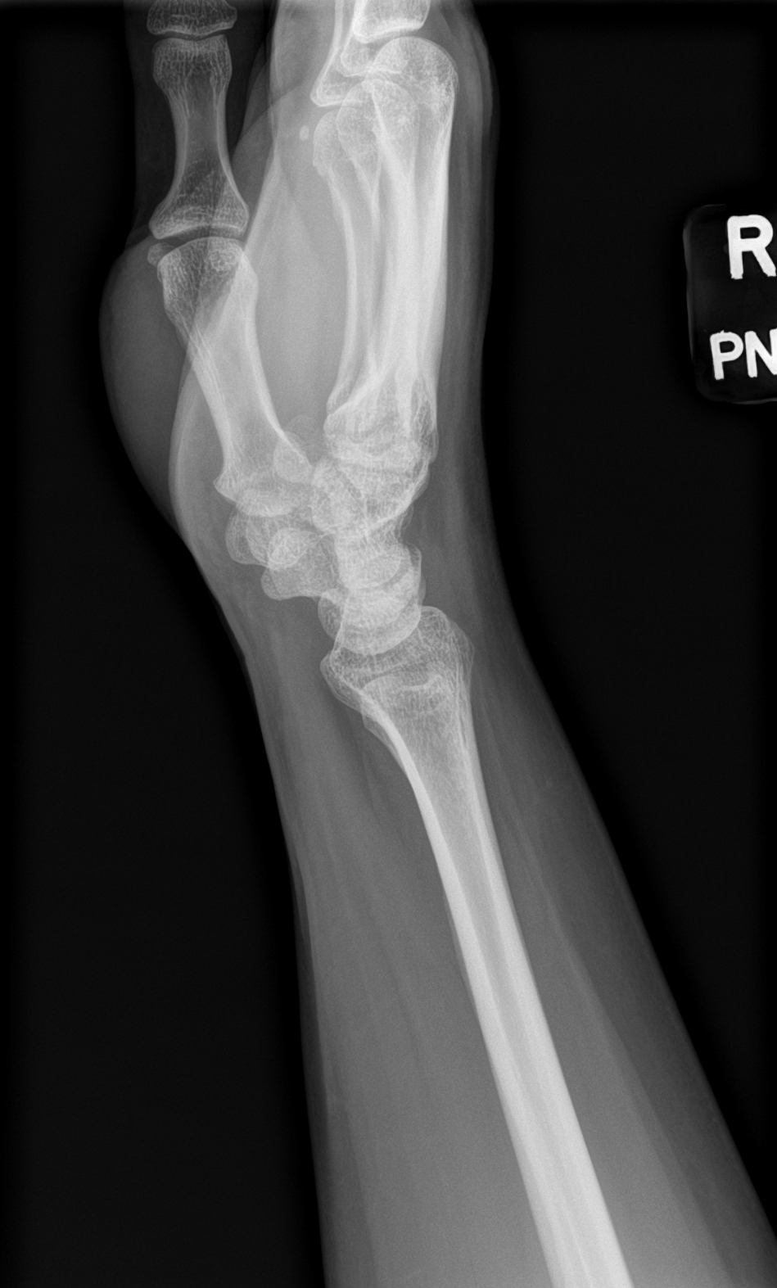

[scaphoid]
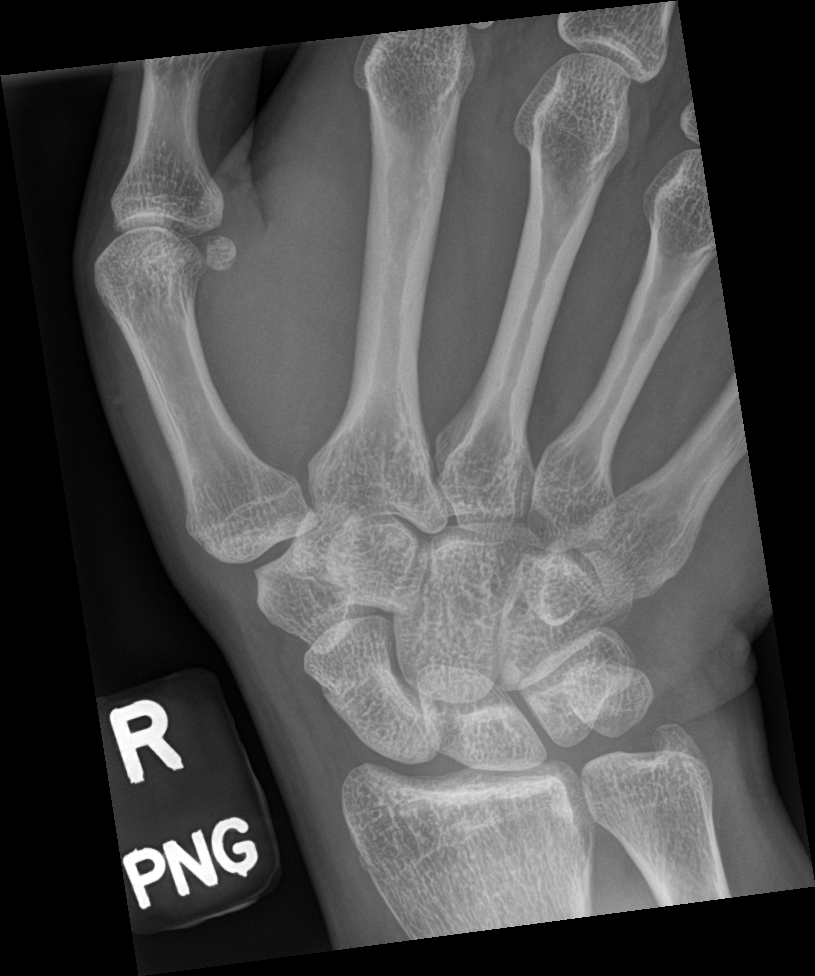

[4 of 4 positions shown; findings below may reference images not displayed]

FINDINGS: There is no evidence of fracture or dislocation. There is no
evidence of arthropathy or other focal bone abnormality. Soft
tissues are unremarkable.
IMPRESSION: Negative.

## 2022-12-02 ENCOUNTER — Emergency Department (HOSPITAL_BASED_OUTPATIENT_CLINIC_OR_DEPARTMENT_OTHER)
Admission: EM | Admit: 2022-12-02 | Discharge: 2022-12-02 | Disposition: A | Discharge status: Home / Self Care | Payer: Self-pay | Attending: Emergency Medicine | Admitting: Emergency Medicine

## 2022-12-02 ENCOUNTER — Telehealth: Payer: Self-pay | Admitting: *Deleted

## 2022-12-02 ENCOUNTER — Other Ambulatory Visit: Payer: Self-pay

## 2022-12-02 ENCOUNTER — Encounter (HOSPITAL_BASED_OUTPATIENT_CLINIC_OR_DEPARTMENT_OTHER): Payer: Self-pay | Admitting: Emergency Medicine

## 2022-12-02 ENCOUNTER — Emergency Department (HOSPITAL_BASED_OUTPATIENT_CLINIC_OR_DEPARTMENT_OTHER): Payer: Self-pay

## 2022-12-02 DIAGNOSIS — W0110XA Fall on same level from slipping, tripping and stumbling with subsequent striking against unspecified object, initial encounter: Secondary | ICD-10-CM | POA: Insufficient documentation

## 2022-12-02 DIAGNOSIS — R519 Headache, unspecified: Secondary | ICD-10-CM | POA: Insufficient documentation

## 2022-12-02 DIAGNOSIS — W19XXXA Unspecified fall, initial encounter: Secondary | ICD-10-CM

## 2022-12-02 DIAGNOSIS — R200 Anesthesia of skin: Secondary | ICD-10-CM | POA: Insufficient documentation

## 2022-12-02 MED ORDER — SODIUM CHLORIDE 0.9 % IV BOLUS: 1000.0000 mL | Freq: Once | INTRAVENOUS | Status: DC

## 2022-12-02 MED ORDER — ALPRAZOLAM 1 MG PO TABS: ORAL_TABLET | ORAL | 0 refills | Status: DC

## 2022-12-02 NOTE — ED Notes (Signed)
Patient transported to CT 

## 2022-12-02 NOTE — Telephone Encounter (Signed)
Transition Care Management successful Follow-up Telephone Call  Date of discharge and from where:  12/02/22 pt states he can not make f/u appt due to not having any insurance at this time. Will try to get back in w/ MD once receive insurance.Marland KitchenRaechel Chute

## 2022-12-02 NOTE — ED Triage Notes (Signed)
Pt states he fell yesterday morning and was unable to get help for several hours  Pt states they recommended him to come in but he did not think it was necessary  Pt states last night when he was laying in bed he noticed numbness to his head  Pt states the left side of his head is numb

## 2022-12-02 NOTE — ED Provider Notes (Signed)
[facility] EMERGENCY DEPARTMENT Provider Note   CSN: [id number] Arrival date & time: 12/02/22  0865     History  Chief Complaint  Patient presents with   Jacob Blackburn is a 46 y.o. male.  Patient here after fall where he hit the left side of his head.  Having some pain and numbness in the left side of his head.  Suffers from anxiety.  Having a lot of anxiousness.  Was able to get up eventually after some support and drive here on his own.  Sometimes when he is very anxious he has a hard time getting himself up.  Patient denies any neck pain, extremity pain.  He has not been able to sleep tonight.  He does not eat or drink much.  Overall he denies any chest pain or shortness of breath, nausea, vomiting, abdominal pain.  The history is provided by the patient.       Home Medications Prior to Admission medications   Medication Sig Start Date End Date Taking? Authorizing Provider  ALPRAZolam Prudy Feeler) 1 MG tablet Take 1 tablet by mouth twice daily as needed for anxiety 12/02/22   Virgina Norfolk, DO  Cholecalciferol (VITAMIN D3) 2000 units capsule Take 1 capsule (2,000 Units total) by mouth daily. 04/30/16   Plotnikov, Georgina Quint, MD  Cyanocobalamin (VITAMIN B-12) 500 MCG SUBL 1 sl qd 04/30/16   Plotnikov, Georgina Quint, MD  diphenhydrAMINE (BENADRYL) 25 MG tablet 1/2 tablet by mouth as needed (for sleep or nerves)    [provider]  Inulin 833.25 MG CAPS Take by mouth. 3 grams, 3/day    [provider]  losartan (COZAAR) 100 MG tablet TAKE 1 TABLET(100 MG) BY MOUTH DAILY 04/29/21   Plotnikov, Georgina Quint, MD  Magnesium 500 MG TABS Take by mouth.    [provider]  omeprazole (PRILOSEC) 20 MG capsule TAKE 1 CAPSULE BY MOUTH DAILY Overdue for Annual appt must see provider for future refills 06/05/21   Plotnikov, Georgina Quint, MD      Allergies    Lisinopril and Other    Review of Systems   Review of Systems  Physical Exam Updated Vital Signs BP (!)  152/103 (BP Location: Left Arm)   Pulse (!) 132   Temp 98.2 F (36.8 C) (Oral)   Resp 16   Ht 6\' 4"  (1.93 m)   Wt 129.3 kg   SpO2 98%   BMI 34.69 kg/m  Physical Exam Vitals and nursing note reviewed.  Constitutional:      General: He is not in acute distress.    Appearance: He is well-developed. He is not ill-appearing.  HENT:     Head: Normocephalic and atraumatic.     Nose: Nose normal.     Mouth/Throat:     Mouth: Mucous membranes are moist.  Eyes:     Extraocular Movements: Extraocular movements intact.     Conjunctiva/sclera: Conjunctivae normal.     Pupils: Pupils are equal, round, and reactive to light.  Cardiovascular:     Rate and Rhythm: Normal rate and regular rhythm.     Pulses: Normal pulses.     Heart sounds: Normal heart sounds. No murmur heard. Pulmonary:     Effort: Pulmonary effort is normal. No respiratory distress.     Breath sounds: Normal breath sounds.  Abdominal:     Palpations: Abdomen is soft.     Tenderness: There is no abdominal tenderness.  Musculoskeletal:  General: No swelling.     Cervical back: Normal range of motion and neck supple.  Skin:    General: Skin is warm and dry.     Capillary Refill: Capillary refill takes less than 2 seconds.  Neurological:     General: No focal deficit present.     Mental Status: He is alert and oriented to person, place, and time. Mental status is at baseline.     Cranial Nerves: No cranial nerve deficit.     Sensory: No sensory deficit.     Motor: No weakness.     Coordination: Coordination normal.     Gait: Gait normal.  Psychiatric:        Mood and Affect: Mood normal.     Comments: Anxious but denies SI     ED Results / Procedures / Treatments   Labs (all labs ordered are listed, but only abnormal results are displayed) Labs Reviewed - No data to display  EKG None  Radiology CT Head Wo Contrast  Result Date: 12/02/2022 CLINICAL DATA:  46 year old male status post fall yesterday.  Left side head numbness. EXAM: CT HEAD WITHOUT CONTRAST TECHNIQUE: Contiguous axial images were obtained from the base of the skull through the vertex without intravenous contrast. RADIATION DOSE REDUCTION: This exam was performed according to the departmental dose-optimization program which includes automated exposure control, adjustment of the mA and/or kV according to patient size and/or use of iterative reconstruction technique. COMPARISON:  None Available. FINDINGS: Brain: Partially empty sella. Background cerebral volume is within normal limits. No midline shift, ventriculomegaly, mass effect, evidence of mass lesion, intracranial hemorrhage or evidence of cortically based acute infarction. Gray-white matter differentiation is within normal limits throughout the brain. Vascular: No suspicious intracranial vascular hyperdensity. Skull: No acute osseous abnormality identified. Sinuses/Orbits: Tympanic cavities, Visualized paranasal sinuses and mastoids are clear. Other: No orbit or scalp soft tissue injury identified. Visualized scalp soft tissues are within normal limits. IMPRESSION: 1. No acute traumatic injury identified. No scalp abnormality identified. 2. Partially empty sella, often a normal anatomic variant but can be associated with idiopathic intracranial hypertension (pseudotumor cerebri). Otherwise normal noncontrast CT appearance of the brain. Electronically Signed   By: Odessa Fleming M.D.   On: 12/02/2022 06:59    Procedures Procedures    Medications Ordered in ED Medications - No data to display  ED Course/ Medical Decision Making/ A&P                           Medical Decision Making Risk Prescription drug management.   [name] is here after fall with headache.  CT scan performed per my review and interpretation shows no acute findings.  Neurologically he is intact.  He has a history of anxiety.  He is having some anxiety symptoms but has not take his Xanax as he drove here.   Overall he has no other complaints.  He has no other extremity tenderness.  No neck tenderness.  Did not lose consciousness.  He tripped over something in his room.  Sometimes he has a hard time getting himself up if he is on the floor.  He needed assistance from fire department to get up as he is got a lot of clutter in his room.  Overall he was able to drive here.  He is ambulatory in the room.  He does admit to anxiousness but denies SI.  He is running low on his Xanax and does not  have his primary care doctor anymore because he lost his insurance.  Will represcribe him a short course of Xanax and give him some resources for outpatient follow-up.  Overall patient discharged in good condition.  Understands return precautions.  This chart was dictated using voice recognition software.  Despite best efforts to proofread,  errors can occur which can change the documentation meaning.         Final Clinical Impression(s) / ED Diagnoses Final diagnoses:  Fall, initial encounter    Rx / DC Orders ED Discharge Orders          Ordered    ALPRAZolam (XANAX) 1 MG tablet        12/02/22 0724              Virgina Norfolk, DO 12/02/22 6433

## 2024-05-25 ENCOUNTER — Telehealth: Payer: Self-pay

## 2024-05-25 NOTE — Telephone Encounter (Signed)
 Copied from CRM [id number]. Topic: Appointments - Scheduling Inquiry for Clinic >> May 25, 2024 11:05 AM [name] wrote: Reason for CRM: Patient last seen Dr. Georgia Kipper 2021- he hasn't been seen due to him getting laid off from his job and not having insurance. He wants to know if Dr. Georgia Kipper will take him back on as a patient for his depression/medications. Please call patient to let him know at [phone].

## 2024-05-26 ENCOUNTER — Ambulatory Visit: Payer: Self-pay | Admitting: Student

## 2024-05-27 NOTE — Telephone Encounter (Signed)
Okay to schedule with me.  Thank you 

## 2024-10-30 ENCOUNTER — Emergency Department (HOSPITAL_COMMUNITY)
Admission: EM | Admit: 2024-10-30 | Discharge: 2024-10-30 | Disposition: A | Discharge status: Home / Self Care | Payer: MEDICAID | Attending: Emergency Medicine | Admitting: Emergency Medicine

## 2024-10-30 ENCOUNTER — Emergency Department (HOSPITAL_COMMUNITY): Payer: MEDICAID

## 2024-10-30 ENCOUNTER — Other Ambulatory Visit: Payer: Self-pay

## 2024-10-30 ENCOUNTER — Encounter (HOSPITAL_COMMUNITY): Payer: Self-pay

## 2024-10-30 DIAGNOSIS — R569 Unspecified convulsions: Secondary | ICD-10-CM

## 2024-10-30 DIAGNOSIS — D696 Thrombocytopenia, unspecified: Secondary | ICD-10-CM | POA: Insufficient documentation

## 2024-10-30 DIAGNOSIS — G250 Essential tremor: Secondary | ICD-10-CM

## 2024-10-30 DIAGNOSIS — I10 Essential (primary) hypertension: Secondary | ICD-10-CM | POA: Insufficient documentation

## 2024-10-30 DIAGNOSIS — F13239 Sedative, hypnotic or anxiolytic dependence with withdrawal, unspecified: Secondary | ICD-10-CM

## 2024-10-30 DIAGNOSIS — R7989 Other specified abnormal findings of blood chemistry: Secondary | ICD-10-CM | POA: Insufficient documentation

## 2024-10-30 DIAGNOSIS — Z79899 Other long term (current) drug therapy: Secondary | ICD-10-CM | POA: Insufficient documentation

## 2024-10-30 LAB — CBC WITH DIFFERENTIAL/PLATELET
Abs Immature Granulocytes: 0.02 K/uL (ref 0.00–0.07)
Basophils Absolute: 0 K/uL (ref 0.0–0.1)
Basophils Relative: 1 %
Eosinophils Absolute: 0 K/uL (ref 0.0–0.5)
Eosinophils Relative: 0 %
HCT: 43.8 % (ref 39.0–52.0)
Hemoglobin: 15.2 g/dL (ref 13.0–17.0)
Immature Granulocytes: 0 %
Lymphocytes Relative: 11 %
Lymphs Abs: 0.7 K/uL (ref 0.7–4.0)
MCH: 34.1 pg — ABNORMAL HIGH (ref 26.0–34.0)
MCHC: 34.7 g/dL (ref 30.0–36.0)
MCV: 98.2 fL (ref 80.0–100.0)
Monocytes Absolute: 0.5 K/uL (ref 0.1–1.0)
Monocytes Relative: 9 %
Neutro Abs: 4.7 K/uL (ref 1.7–7.7)
Neutrophils Relative %: 79 %
Platelets: 76 K/uL — ABNORMAL LOW (ref 150–400)
RBC: 4.46 MIL/uL (ref 4.22–5.81)
RDW: 19.2 % — ABNORMAL HIGH (ref 11.5–15.5)
WBC: 6 K/uL (ref 4.0–10.5)
nRBC: 0 % (ref 0.0–0.2)

## 2024-10-30 LAB — COMPREHENSIVE METABOLIC PANEL WITH GFR
ALT: 88 U/L — ABNORMAL HIGH (ref 0–44)
AST: 129 U/L — ABNORMAL HIGH (ref 15–41)
Albumin: 4.5 g/dL (ref 3.5–5.0)
Alkaline Phosphatase: 72 U/L (ref 38–126)
Anion gap: 15 (ref 5–15)
BUN: 17 mg/dL (ref 6–20)
CO2: 27 mmol/L (ref 22–32)
Calcium: 10.1 mg/dL (ref 8.9–10.3)
Chloride: 95 mmol/L — ABNORMAL LOW (ref 98–111)
Creatinine, Ser: 1.02 mg/dL (ref 0.61–1.24)
GFR, Estimated: >60 mL/min (ref >60)
Glucose, Bld: 150 mg/dL — ABNORMAL HIGH (ref 70–99)
Potassium: 4.2 mmol/L (ref 3.5–5.1)
Sodium: 137 mmol/L (ref 135–145)
Total Bilirubin: 2.9 mg/dL — ABNORMAL HIGH (ref 0.0–1.2)
Total Protein: 7.7 g/dL (ref 6.5–8.1)

## 2024-10-30 LAB — I-STAT CHEM 8, ED
BUN: 20 mg/dL (ref 6–20)
Calcium, Ion: 1.16 mmol/L (ref 1.15–1.40)
Chloride: 97 mmol/L — ABNORMAL LOW (ref 98–111)
Creatinine, Ser: 1 mg/dL (ref 0.61–1.24)
Glucose, Bld: 147 mg/dL — ABNORMAL HIGH (ref 70–99)
HCT: 47 % (ref 39.0–52.0)
Hemoglobin: 16 g/dL (ref 13.0–17.0)
Potassium: 4.1 mmol/L (ref 3.5–5.1)
Sodium: 135 mmol/L (ref 135–145)
TCO2: 30 mmol/L (ref 22–32)

## 2024-10-30 LAB — RESP PANEL BY RT-PCR (RSV, FLU A&B, COVID)  RVPGX2
Influenza A by PCR: NEGATIVE
Influenza B by PCR: NEGATIVE
Resp Syncytial Virus by PCR: NEGATIVE
SARS Coronavirus 2 by RT PCR: NEGATIVE

## 2024-10-30 LAB — MAGNESIUM: Magnesium: 2.1 mg/dL (ref 1.7–2.4)

## 2024-10-30 LAB — ETHANOL: Alcohol, Ethyl (B): <15 mg/dL (ref <15)

## 2024-10-30 LAB — LIPASE, BLOOD: Lipase: 199 U/L — ABNORMAL HIGH (ref 11–51)

## 2024-10-30 LAB — CBG MONITORING, ED: Glucose-Capillary: 158 mg/dL — ABNORMAL HIGH (ref 70–99)

## 2024-10-30 MED ORDER — IOHEXOL 350 MG/ML SOLN
75.0000 mL | Freq: Once | INTRAVENOUS | Status: AC | PRN
  Administered 2024-10-30: 75 mL via INTRAVENOUS

## 2024-10-30 MED ORDER — ONDANSETRON HCL 4 MG/2ML IJ SOLN
4.0000 mg | Freq: Once | INTRAMUSCULAR | Status: AC
  Administered 2024-10-30: 4 mg via INTRAVENOUS
  Filled 2024-10-30: qty 2

## 2024-10-30 MED ORDER — SODIUM CHLORIDE 0.9 % IV BOLUS
1000.0000 mL | Freq: Once | INTRAVENOUS | Status: AC
  Administered 2024-10-30: 1000 mL via INTRAVENOUS

## 2024-10-30 MED ORDER — CLONAZEPAM 0.5 MG PO TABS: 0.5000 mg | ORAL_TABLET | Freq: Two times a day (BID) | ORAL | 0 refills | Status: AC | PRN

## 2024-10-30 MED ORDER — GADOBUTROL 1 MMOL/ML IV SOLN: 10.0000 mL | Freq: Once | INTRAVENOUS | Status: DC | PRN

## 2024-10-30 MED ORDER — LORAZEPAM 2 MG/ML IJ SOLN: 1.0000 mg | Freq: Once | INTRAMUSCULAR | Status: DC | PRN

## 2024-10-30 MED ORDER — CLONAZEPAM 0.5 MG PO TABS
1.0000 mg | ORAL_TABLET | Freq: Every day | ORAL | Status: DC
  Administered 2024-10-30: 1 mg via ORAL
  Filled 2024-10-30: qty 2

## 2024-10-30 MED ORDER — GADOBUTROL 1 MMOL/ML IV SOLN
10.0000 mL | Freq: Once | INTRAVENOUS | Status: AC | PRN
  Administered 2024-10-30: 10 mL via INTRAVENOUS

## 2024-10-30 NOTE — Consult Note (Signed)
 NEUROLOGY CONSULT NOTE   Date of service: October 30, 2024 Patient Name: Jacob Blackburn MRN:  [id number] DOB:  28-Mar-1976 Chief Complaint: seizure Requesting Provider: Towana Ozell BROCKS, MD  History of Present Illness  Jacob Blackburn is a 48 y.o. male with hx of anxiety treated with daily Xanax  who presents with new onset seizure and worsening tremor.  He apparently has had a tremor that has been going back at least multiple months, but is worse today than it typically is.  He has a history of anxiety for which he takes Xanax , taking 1 to 2 mg every day.  He states that it has been a little bit over 48 hours since he last took a dose, but he typically takes a dose every day to keep it in his system.  Today, he was sitting down, and was noted to make an abnormal vocalization, followed by generalized convulsion with bilateral arm flexion.  His eyes were open, rolled back and possibly slightly to the left.  He convulse for approximately 45 seconds and then was in and out of consciousness for approximately 10 minutes.  He has since returned to baseline cognitively, but his tremor is worse than it typically is.  Past History   Past Medical History:  Diagnosis Date   Allergy    Blood in stool    GERD (gastroesophageal reflux disease)    History of chicken pox    Kidney stones    Migraines    Spontaneous pneumothorax    at 48 yo    Past Surgical History:  Procedure Laterality Date   PLEURAL SCARIFICATION  1996   left lung    Family History: Family History  Problem Relation Age of Onset   Arthritis Other    Cancer Other        Colon Cancer-Parent, Grandparent, Other relative   Heart disease Other    Hypertension Other    Asthma Mother    Arthritis Mother    Mental illness Mother        depression   Asthma Father    Hyperlipidemia Father     Social History  reports that he has never smoked. He has never used smokeless tobacco. He reports current alcohol use. He  reports that he does not use drugs.  Allergies  Allergen Reactions   Lisinopril     SOB   Other     Poppy seeds (Opiates)    Medications   Current Facility-Administered Medications:    clonazePAM (KLONOPIN) tablet 1 mg, 1 mg, Oral, Daily, Barrett, Jamie N, PA-C   gadobutrol (GADAVIST) 1 MMOL/ML injection 10 mL, 10 mL, Intravenous, Once PRN, Barrett, Jamie N, PA-C  Current Outpatient Medications:    acetaminophen (TYLENOL) 500 MG tablet, Take 500 mg by mouth every 6 (six) hours as needed for moderate pain (pain score 4-6)., Disp: , Rfl:    ALPRAZolam  (XANAX ) 1 MG tablet, Take 1 tablet by mouth twice daily as needed for anxiety (Patient taking differently: Take 2 mg by mouth daily as needed for anxiety. Take 1 tablet by mouth twice daily as needed for anxiety), Disp: 20 tablet, Rfl: 0   Cholecalciferol (VITAMIN D3) 2000 units capsule, Take 1 capsule (2,000 Units total) by mouth daily., Disp: 100 capsule, Rfl: 3   Cyanocobalamin (VITAMIN B-12) 500 MCG SUBL, 1 sl qd, Disp: 150 tablet, Rfl: 3   diphenhydrAMINE (BENADRYL) 25 MG tablet, 25 mg at bedtime as needed for allergies or sleep. 1/2 tablet by mouth as needed (  for sleep or nerves), Disp: , Rfl:    Ginger, Zingiber officinalis, (GINGER ROOT PO), Take 1 capsule by mouth 2 (two) times a week., Disp: , Rfl:    Ibuprofen 200 MG CAPS, Take 200 capsules by mouth daily as needed (pain)., Disp: , Rfl:    losartan  (COZAAR ) 100 MG tablet, TAKE 1 TABLET(100 MG) BY MOUTH DAILY, Disp: 30 tablet, Rfl: 1   omeprazole  (PRILOSEC) 20 MG capsule, TAKE 1 CAPSULE BY MOUTH DAILY Overdue for Annual appt must see provider for future refills, Disp: 30 capsule, Rfl: 0  Vitals   Vitals:   2024-11-03 1548 03-Nov-2024 1552 11-03-2024 1830 11-03-24 1933  BP:   (!) 140/88   Pulse:   93   Resp:   18   Temp: 99 F (37.2 C)   99.1 F (37.3 C)  TempSrc: Oral   Oral  SpO2:   100%   Weight:  117.9 kg    Height:  6' 4 (1.93 m)      Body mass index is 31.65  kg/m.   Physical Exam   Constitutional: Appears well-developed and well-nourished.   Neurologic Examination    Neuro: Mental Status: Patient is awake, alert, oriented to person, place, month, year, and situation. Patient is able to give a clear and coherent history. No signs of aphasia or neglect Cranial Nerves: II: Visual Fields are full. Pupils are equal, round, and reactive to light.   III,IV, VI: EOMI without ptosis or diploplia.  V: Facial sensation is symmetric to temperature VII: Facial movement is symmetric.  VIII: hearing is intact to voice X: Uvula elevates symmetrically XII: tongue is midline without atrophy or fasciculations.  Motor: Tone is normal. Bulk is normal. 5/5 strength was present in all four extremities.   He has a very prominent tremor with both intentional and postural component bilaterally which does not appear distractible. Sensory: Sensation is symmetric to light touch and temperature in the arms and legs. Cerebellar: Tremor as above without past-pointing       Labs/Imaging/Neurodiagnostic studies   CBC:  Recent Labs  Lab 11-03-24 1253 03-Nov-2024 1432  WBC 6.0  --   NEUTROABS 4.7  --   HGB 15.2 16.0  HCT 43.8 47.0  MCV 98.2  --   PLT 76*  --    Basic Metabolic Panel:  Lab Results  Component Value Date   NA 135 2024-11-03   K 4.1 2024-11-03   CO2 27 November 03, 2024   GLUCOSE 147 (H) 11-03-24   BUN 20 2024/11/03   CREATININE 1.00 November 03, 2024   CALCIUM 10.1 11-03-2024   GFRNONAA >60 11-03-2024   Lipid Panel:  Lab Results  Component Value Date   LDLCALC 133 (H) 05/04/2017   Alcohol Level     Component Value Date/Time   [facility] <15 03-Nov-2024 1253    MRI Brain(Personally reviewed): Negative   ASSESSMENT   Jacob Blackburn is a 48 y.o. male with gradually worsening tremor over the past few months who presents with new onset seizure in the setting of not taking Xanax  for several days after taking it daily at 1 to 2 mg/day for a  very long time.  This is the timeframe that Xanax  withdrawal seizures can be seen, and I strongly suspect that this played a big role today.  There is some suggestion, however, of focality with left gaze, and therefore I do think I agree with sticking to driving prohibitions consistent with state law.  His tremor has the appearance of an essential tremor,  and it is likely that he has an underlying tremor that is exacerbating in the setting of his benzodiazepine withdrawal.  RECOMMENDATIONS  I discussed that alprazolam  is a medication that lends itself to rapid withdrawal, and if he needs a benzodiazepine on a daily basis I would strongly encourage him to consider changing to another medication such as Klonopin. In the short-term, I do think he needs a long-acting benzodiazepine which could be weaned down as an outpatient if need be. As an outpatient, he should consider other treatments for anxiety such as SSRI, etc. Would consider outpatient neurology referral for essential tremor management as well as routine EEG. Please call if neurology can be of any other further assistance ______________________________________________________________________    Signed, Aisha Seals, MD [facility]

## 2024-10-30 NOTE — ED Notes (Signed)
 CCMD Called

## 2024-10-30 NOTE — ED Notes (Signed)
 Patient ambulated in the hall independently. Patient still has tremors on the upper half of his body but he ambulated well.

## 2024-10-30 NOTE — ED Triage Notes (Signed)
 Pt BIB [facility] from home. EMS reported he had new onset of seizure activity witness by family. No hx of seizure activity. He does get hypoglycemia in the pass. Blood sugar for EMS 188. Patient was tachycardic 130 for EMS. Patient reported he had the last week and since then he has been having in tensional tremors, but when at rest he does not have the tremors. Patient A & O x4. Vital signs: Bp 148/80, Pulse 118, RR 18.

## 2024-10-30 NOTE — ED Provider Notes (Signed)
 [place] EMERGENCY DEPARTMENT AT [facility] Provider Note   CSN: [id number] Arrival date & time: 10/30/24  1233     Patient presents with: Seizures   ANTONIUS HARTLAGE is a 48 y.o. male. Patient brought in by EMS for new onset seizure.  Family reports that he was sitting on a recliner and then he started having generalized shaking and eyes rolled in the back of his head lasting approximately 45 seconds.  Then had a period of significant confusion lasting up until EMS arrived.  Patient reports that over the last 4 to 5 days he has had abdominal pain nausea vomiting diarrhea and very poor oral intake. Reports he has started to have new tremor with movements, not there at rest. He reports he has had low-grade fever but has not checked temperature at home.  Patient reports he remembers going to take a nap and then he suddenly woke up with a jerking motion and went back to sleep.  He does not recall anything else.  He has no history of seizure and denies any injury trauma or fall.  No headache, CP, SOB, cough. Denies drug/ETOH.    HPI     Prior to Admission medications   Medication Sig Start Date End Date Taking? Authorizing Provider  acetaminophen (TYLENOL) 500 MG tablet Take 500 mg by mouth every 6 (six) hours as needed for moderate pain (pain score 4-6).   Yes [provider]  ALPRAZolam  (XANAX ) 1 MG tablet Take 1 tablet by mouth twice daily as needed for anxiety Patient taking differently: Take 2 mg by mouth daily as needed for anxiety. Take 1 tablet by mouth twice daily as needed for anxiety 12/02/22  Yes Curatolo, Adam, DO  Cholecalciferol (VITAMIN D3) 2000 units capsule Take 1 capsule (2,000 Units total) by mouth daily. 04/30/16  Yes Plotnikov, Aleksei V, MD  Cyanocobalamin (VITAMIN B-12) 500 MCG SUBL 1 sl qd 04/30/16  Yes Plotnikov, Aleksei V, MD  diphenhydrAMINE (BENADRYL) 25 MG tablet 25 mg at bedtime as needed for allergies or sleep. 1/2 tablet by mouth as needed (for  sleep or nerves)   Yes [provider]  Ginger, Zingiber officinalis, (GINGER ROOT PO) Take 1 capsule by mouth 2 (two) times a week.   Yes [provider]  Ibuprofen 200 MG CAPS Take 200 capsules by mouth daily as needed (pain).   Yes [provider]  losartan  (COZAAR ) 100 MG tablet TAKE 1 TABLET(100 MG) BY MOUTH DAILY 04/29/21  Yes Plotnikov, Aleksei V, MD  omeprazole  (PRILOSEC) 20 MG capsule TAKE 1 CAPSULE BY MOUTH DAILY Overdue for Annual appt must see provider for future refills 06/05/21  Yes Plotnikov, Aleksei V, MD    Allergies: Lisinopril and Other    Review of Systems  Constitutional:  Positive for fatigue.    Updated Vital Signs BP (!) 140/88   Pulse 93   Temp 99.1 F (37.3 C) (Oral)   Resp 18   Ht 6' 4 (1.93 m)   Wt 117.9 kg   SpO2 100%   BMI 31.65 kg/m   Physical Exam Vitals and nursing note reviewed.  Constitutional:      General: He is not in acute distress.    Appearance: He is ill-appearing. He is not toxic-appearing.  HENT:     Head: Normocephalic and atraumatic.  Eyes:     General: No scleral icterus.    Conjunctiva/sclera: Conjunctivae normal.  Cardiovascular:     Rate and Rhythm: Regular rhythm. Tachycardia present.  Pulses: Normal pulses.     Heart sounds: Normal heart sounds.  Pulmonary:     Effort: Pulmonary effort is normal. No respiratory distress.     Breath sounds: Normal breath sounds.  Abdominal:     General: Abdomen is flat. Bowel sounds are normal. There is no distension.     Palpations: Abdomen is soft.     Tenderness: There is abdominal tenderness.  Musculoskeletal:     Right lower leg: No edema.     Left lower leg: No edema.  Skin:    General: Skin is warm and dry.     Findings: No lesion.  Neurological:     General: No focal deficit present.     Mental Status: He is alert and oriented to person, place, and time. Mental status is at baseline.     Comments: Fine tremor with movement.  No focal  neurological deficit.     (all labs ordered are listed, but only abnormal results are displayed) Labs Reviewed  COMPREHENSIVE METABOLIC PANEL WITH GFR - Abnormal; Notable for the following components:      Result Value   Chloride 95 (*)    Glucose, Bld 150 (*)    AST 129 (*)    ALT 88 (*)    Total Bilirubin 2.9 (*)    All other components within normal limits  CBC WITH DIFFERENTIAL/PLATELET - Abnormal; Notable for the following components:   MCH 34.1 (*)    RDW 19.2 (*)    Platelets 76 (*)    All other components within normal limits  LIPASE, BLOOD - Abnormal; Notable for the following components:   Lipase 199 (*)    All other components within normal limits  CBG MONITORING, ED - Abnormal; Notable for the following components:   Glucose-Capillary 158 (*)    All other components within normal limits  I-STAT CHEM 8, ED - Abnormal; Notable for the following components:   Chloride 97 (*)    Glucose, Bld 147 (*)    All other components within normal limits  RESP PANEL BY RT-PCR (RSV, FLU A&B, COVID)  RVPGX2  MAGNESIUM  ETHANOL  RAPID URINE DRUG SCREEN, HOSP PERFORMED  URINALYSIS, ROUTINE W REFLEX MICROSCOPIC    EKG: EKG Interpretation Date/Time:  Sunday October 30 2024 18:22:15 EST Ventricular Rate:  99 PR Interval:  151 QRS Duration:  90 QT Interval:  339 QTC Calculation: 435 R Axis:   7  Text Interpretation: Sinus rhythm Consider anterior infarct No old tracing to compare Confirmed by Towana Sharper [phone]) on 10/30/2024 6:23:37 PM  Radiology: MR Brain W and Wo Contrast Result Date: 10/30/2024 EXAM: MRI BRAIN WITH AND WITHOUT CONTRAST 10/30/2024 07:06:03 PM TECHNIQUE: Multiplanar multisequence MRI of the head/brain was performed with and without the administration of intravenous contrast. COMPARISON: Comparison with CT performed earlier the same day. CLINICAL HISTORY: Seizure, new-onset, no history of trauma. FINDINGS: BRAIN AND VENTRICLES: No acute infarct. No acute  intracranial hemorrhage. No mass effect or midline shift. No hydrocephalus. The sella is unremarkable. Normal flow voids. No mass or abnormal enhancement. ORBITS: No acute abnormality. SINUSES: No acute abnormality. BONES AND SOFT TISSUES: Normal bone marrow signal and enhancement. No acute soft tissue abnormality. IMPRESSION: 1. Normal brain MRI. No acute intracranial abnormality. Electronically signed by: Morene Hoard MD 10/30/2024 07:34 PM EST RP Workstation: [id number]   CT ABDOMEN PELVIS W CONTRAST Result Date: 10/30/2024 CLINICAL DATA:  Abdominal pain, acute, nonlocalized. Seizure activity. EXAM: CT ABDOMEN AND PELVIS WITH CONTRAST TECHNIQUE: Multidetector CT  imaging of the abdomen and pelvis was performed using the standard protocol following bolus administration of intravenous contrast. RADIATION DOSE REDUCTION: This exam was performed according to the departmental dose-optimization program which includes automated exposure control, adjustment of the mA and/or kV according to patient size and/or use of iterative reconstruction technique. CONTRAST:  75mL OMNIPAQUE IOHEXOL 350 MG/ML SOLN COMPARISON:  05/18/2017. FINDINGS: Lower chest: No acute abnormality. Hepatobiliary: No focal liver abnormality is seen. Fatty infiltration of the liver is noted. No gallstones, gallbladder wall thickening, or biliary dilatation. Pancreas: Unremarkable. No pancreatic ductal dilatation or surrounding inflammatory changes. Spleen: Normal in size without focal abnormality. Adrenals/Urinary Tract: The adrenal glands are within normal limits. The kidneys enhance symmetrically. No renal calculus or hydronephrosis bilaterally. The bladder is unremarkable. Stomach/Bowel: The stomach is within normal limits. No bowel obstruction, free air, or pneumatosis is seen. Scattered diverticula are present along the colon without evidence of diverticulitis. Appendix appears normal. Vascular/Lymphatic: No significant vascular findings  are present. No enlarged abdominal or pelvic lymph nodes. Reproductive: Prostate is unremarkable. Other: No abdominopelvic ascites. Musculoskeletal: No acute osseous abnormality. IMPRESSION: 1. No acute intra-abdominal process. 2. Hepatic steatosis. Electronically Signed   By: Leita Birmingham M.D.   On: 10/30/2024 16:05   CT Head Wo Contrast Result Date: 10/30/2024 EXAM: CT HEAD WITHOUT CONTRAST 10/30/2024 03:34:28 PM TECHNIQUE: CT of the head was performed without the administration of intravenous contrast. Automated exposure control, iterative reconstruction, and/or weight based adjustment of the mA/kV was utilized to reduce the radiation dose to as low as reasonably achievable. COMPARISON: CT head 12/02/2022. CLINICAL HISTORY: Seizure, new-onset, no history of trauma. FINDINGS: BRAIN AND VENTRICLES: There is no evidence of an acute infarct, intracranial hemorrhage, mass, midline shift, hydrocephalus, or extra-axial fluid collection. Cerebral volume is normal. ORBITS: No acute abnormality. SINUSES: No acute abnormality. SOFT TISSUES AND SKULL: No acute soft tissue abnormality. No skull fracture. IMPRESSION: 1. Negative head CT. Electronically signed by: Dasie Hamburg MD 10/30/2024 03:54 PM EST RP Workstation: [id number]   DG Chest 2 View Result Date: 10/30/2024 EXAM: 2 VIEW(S) XRAY OF THE CHEST 10/30/2024 01:47:00 PM COMPARISON: None available. CLINICAL HISTORY: Cough. FINDINGS: LUNGS AND PLEURA: No focal pulmonary opacity. No pulmonary edema. No pleural effusion. No pneumothorax. HEART AND MEDIASTINUM: No acute abnormality of the cardiac and mediastinal silhouettes. BONES AND SOFT TISSUES: No acute osseous abnormality. IMPRESSION: 1. No acute process. Electronically signed by: Lynwood Seip MD 10/30/2024 01:58 PM EST RP Workstation: [id number]     Procedures   Medications Ordered in the ED  LORazepam (ATIVAN) injection 1 mg (has no administration in time range)  gadobutrol (GADAVIST) 1 MMOL/ML injection  10 mL (has no administration in time range)  sodium chloride  0.9 % bolus 1,000 mL (0 mLs Intravenous Stopped 10/30/24 1545)  ondansetron (ZOFRAN) injection 4 mg (4 mg Intravenous Given 10/30/24 1426)  sodium chloride  0.9 % bolus 1,000 mL (1,000 mLs Intravenous New Bag/Given 10/30/24 1544)  iohexol (OMNIPAQUE) 350 MG/ML injection 75 mL (75 mLs Intravenous Contrast Given 10/30/24 1535)  gadobutrol (GADAVIST) 1 MMOL/ML injection 10 mL (10 mLs Intravenous Contrast Given 10/30/24 1906)    Clinical Course as of 10/30/24 1954  Sun Oct 30, 2024  1359 He is brought in by ambulance after a possible seizure at home.  Family said he has been sick for a week with possible flu.  Today had witnessed 45 seconds of unresponsive with increased tone followed by slow improvement back to normal mental status.  Patient does endorse some abdominal pain.  Feeling nauseous.  Getting lab work and imaging.  Disposition per results of testing.  No prior history of seizure [MB]  1953 Dr Michaela to come see patient.  [JB]    Clinical Course User Index [JB] Brixton Franko, Warren SAILOR, PA-C [MB] Towana Ozell BROCKS, MD                                 Medical Decision Making Amount and/or Complexity of Data Reviewed Labs: ordered. Radiology: ordered.  Risk Prescription drug management.   This patient presents to the ED for concern of abdominal pain, this involves an extensive number of treatment options, and is a complaint that carries with it a high risk of complications and morbidity.  The differential diagnosis includes cholecystitis, AAA, appendicitis, renal stone, UTI, SBO    Co morbidities that complicate the patient evaluation  Anxiety, depression, hypertension, GERD   Lab Tests:  I personally interpreted labs.  The pertinent results include:   No leukocytosis, no anemia, new onset thrombocytopenia CMP is remarkable for elevation in AST > ALT and total bilirubin 2.9 Lipase is 199   Imaging Studies ordered:  I  ordered imaging studies including CT head, CT abd/pelvis, chest x-ray, brain MRI I independently visualized and interpreted imaging which showed no acute findings.  I agree with the radiologist interpretation   Cardiac Monitoring: / EKG:  The patient was maintained on a cardiac monitor.  I personally viewed and interpreted the cardiac monitored which showed an underlying rhythm of: sinus tachycardia    Consultations Obtained:  I requested consultation with the Neurology,  and discussed lab and imaging findings as well as pertinent plan - they recommend: MR brain with and without.  MRI brain w/ w/out unremarkable  I did reconsult with neurology and spoke with Dr. Hillman who will come see the patient. Suspect tremor in the setting of withdrawal from Xanax .  Will taper off of benzo and f/u OP with neuro.    Problem List / ED Course / Critical interventions / Medication management  Patient presents emergency room after having new onset seizure at home.  This occurred at rest and he did not have fall/injury with this, did seem to be post-ictal after event.  Patient does not remember.  On arrival patient is hemodynamically stable but persistently tachycardic.  He has no focal deficits but continues to have significant tremor with any movement.  Patient reports this is new over the past 4 to 5 days.  He does also endorse generalized abdominal pain nausea vomiting and poor oral intake, feeling unwell.  He does have significant abdominal tenderness on exam.  Lab work does show new onset thrombocytopenia, mild elevation in liver enzymes and elevation in total bilirubin, question EtOH, but patient denies. I did obtain CT scan of head as well as CT with contrast of abdomen and pelvis which showed no acute findings.  Chest x-ray shows no acute findings. I did discuss with neurology who is ultimately recommending brain MRI with and without contrast given presentation. Brain MRI is unremarkable.  Dr.  Michaela from neurology has come to see patient.  From neurology perspective okay to go home with outpatient follow-up with neurology. He was given strict seizure precautions including not driving for 6 months or doing any activities that would put him or someone else in danger if he were to have another seizure. I ordered medication including zofran, fluids.  Reevaluation of the patient after  these medicines showed that the patient improved I have reviewed the patients home medicines and have made adjustments as needed. Patient stable for discharge.  He was given strict return precautions.  Given follow-up instructions.      Final diagnoses:  Seizure (HCC)  Elevated LFTs  Thrombocytopenia    ED Discharge Orders          Ordered    Ambulatory referral to Neurology       Comments: An appointment is requested in approximately: 2 weeks   10/30/24 1943               Shametra Cumberland, Warren LOISE RIGGERS 10/30/24 2025    Towana Ozell BROCKS, MD 10/31/24 [phone]

## 2024-10-30 NOTE — Discharge Instructions (Addendum)
 Please discontinue Xanax . Also, avoid other drugs and alcohol. In the meantime you can take Klonopin as needed for anxiety and slowly taper off of this medicine. Please follow up with neurology.    It is very important that you avoid activities that would put you are to be put in danger if you were to have another seizure.  Also cannot drive or operate heavy machinery for the next 6 months and until cleared by neurology.  Take Zofran as needed for nausea or vomiting.  Please follow-up with your primary care doctor for elevated LFTs and low platelet count.  Return to ER with new or worsening symptoms.

## 2024-10-30 NOTE — ED Notes (Signed)
 Patient transported to MRI

## 2024-11-07 ENCOUNTER — Encounter (HOSPITAL_BASED_OUTPATIENT_CLINIC_OR_DEPARTMENT_OTHER): Payer: Self-pay

## 2024-11-07 ENCOUNTER — Emergency Department (HOSPITAL_BASED_OUTPATIENT_CLINIC_OR_DEPARTMENT_OTHER)
Admission: EM | Admit: 2024-11-07 | Discharge: 2024-11-07 | Discharge status: Against Medical Advice | Payer: Self-pay | Attending: Emergency Medicine | Admitting: Emergency Medicine

## 2024-11-07 ENCOUNTER — Emergency Department (HOSPITAL_BASED_OUTPATIENT_CLINIC_OR_DEPARTMENT_OTHER): Payer: Self-pay

## 2024-11-07 ENCOUNTER — Other Ambulatory Visit: Payer: Self-pay

## 2024-11-07 DIAGNOSIS — R569 Unspecified convulsions: Secondary | ICD-10-CM | POA: Insufficient documentation

## 2024-11-07 DIAGNOSIS — M25512 Pain in left shoulder: Secondary | ICD-10-CM | POA: Insufficient documentation

## 2024-11-07 DIAGNOSIS — Z5329 Procedure and treatment not carried out because of patient's decision for other reasons: Secondary | ICD-10-CM | POA: Insufficient documentation

## 2024-11-07 NOTE — ED Provider Notes (Signed)
 [place] [facility] [facility] DE[facility] AT [facility] Provider Note   CSN: [id number] Arrival date & time: 11/07/24  1525     Patient presents with: Shoulder Pain   Jacob Blackburn is a 48 y.o. male with history of seizure, presents with concern for left shoulder pain that has been ongoing for the past week.  He states that he had a seizure about 1 week ago, and since then, has been having left shoulder pain.  He states that when he goes to reach for objects, he will hear the shoulder pop and will have a lot of pain associated with this.  No numbness or tingling in his upper extremities.  Denies any weakness in the upper extremities.  Otherwise he feels at baseline.    Shoulder Pain      Prior to Admission medications   Medication Sig Start Date End Date Taking? Authorizing Provider  acetaminophen (TYLENOL) 500 MG tablet Take 500 mg by mouth every 6 (six) hours as needed for moderate pain (pain score 4-6).    [provider]  Cholecalciferol (VITAMIN D3) 2000 units capsule Take 1 capsule (2,000 Units total) by mouth daily. 04/30/16   Plotnikov, Aleksei V, MD  clonazePAM (KLONOPIN) 0.5 MG tablet Take 1 tablet (0.5 mg total) by mouth 2 (two) times daily as needed for anxiety. 10/30/24   Barrett, Jamie N, PA-C  Cyanocobalamin (VITAMIN B-12) 500 MCG SUBL 1 sl qd 04/30/16   Plotnikov, Aleksei V, MD  diphenhydrAMINE (BENADRYL) 25 MG tablet 25 mg at bedtime as needed for allergies or sleep. 1/2 tablet by mouth as needed (for sleep or nerves)    [provider]  Ginger, Zingiber officinalis, (GINGER ROOT PO) Take 1 capsule by mouth 2 (two) times a week.    [provider]  Ibuprofen 200 MG CAPS Take 200 capsules by mouth daily as needed (pain).    [provider]  losartan  (COZAAR ) 100 MG tablet TAKE 1 TABLET(100 MG) BY MOUTH DAILY 04/29/21   Plotnikov, Aleksei V, MD  omeprazole  (PRILOSEC) 20 MG capsule TAKE 1 CAPSULE BY MOUTH DAILY Overdue for Annual appt must  see provider for future refills 06/05/21   Plotnikov, Karlynn GAILS, MD    Allergies: Lisinopril and Other    Review of Systems  Musculoskeletal:        Left shoulder pain    Updated Vital Signs BP (!) 146/97 (BP Location: Right Arm)   Pulse (!) 129   Temp 99.5 F (37.5 C)   Resp 16   SpO2 98%   Physical Exam Vitals and nursing note reviewed.  Constitutional:      Appearance: Normal appearance.     Comments: Extremely anxious appearing.  Tremor at rest in the upper extremities bilaterally  HENT:     Head: Atraumatic.  Cardiovascular:     Comments: 2+ radial pulse bilaterally Pulmonary:     Effort: Pulmonary effort is normal.  Musculoskeletal:     Comments: Left upper extremity:  General No obvious deformity. No erythema, edema, contusions, open wounds   Palpation Non-tender to palpation along the clavicle, AC joint, glenohumeral joint, humeral head, or distal humerus. Non-tender to palpation over the scapular ridge.  Nontender over the radius or ulna  ROM Unable to perform range of motion in the left shoulder due to pain Full flexion and extension at the left elbow and left wrist     Neurological:     General: No focal deficit present.     Mental Status:  He is alert.     Comments: Intact sensation to the upper extremities bilaterally  5/5 strength with resisted elbow flexion and extension, wrist flexion extension bilaterally  Psychiatric:        Mood and Affect: Mood normal.        Behavior: Behavior normal.     (all labs ordered are listed, but only abnormal results are displayed) Labs Reviewed - No data to display  EKG: None  Radiology: DG Shoulder Left Result Date: 11/07/2024 CLINICAL DATA:  Left shoulder injury. EXAM: LEFT SHOULDER - 2+ VIEW COMPARISON:  None Available. FINDINGS: No acute fracture or dislocation. Tiny fragment along the inferior bony glenoid, likely chronic and degenerative. The bones are well mineralized. The soft tissues are  unremarkable IMPRESSION: No acute fracture or dislocation. Electronically Signed   By: Vanetta Chou M.D.   On: 11/07/2024 15:58     Procedures   Medications Ordered in the ED - No data to display                                  Medical Decision Making Amount and/or Complexity of Data Reviewed Radiology: ordered.    Differential diagnosis includes but is not limited to fracture, dislocation, sprain, strain, contusion, laceration, nerve injury, vascular injury, compartment syndrome  ED Course:  Upon initial evaluation, patient is well-appearing, no acute distress.  He is slightly tremulous and nervous appearing.  Reporting pain in the left shoulder.  He does not have any bony tenderness palpation along the clavicle, humerus, radius, ulna.  He is neurovascularly intact in the left upper extremity.  He is unable to perform left shoulder range of motion due to pain.  Will obtain x-ray for further evaluation.  Declines any pain medicine at this time.  Imaging Studies ordered: I ordered imaging studies including x-ray left shoulder I independently visualized the imaging with scope of interpretation limited to determining acute life threatening conditions related to emergency care. Imaging showed no acute abnormalities I agree with the radiologist interpretation    Medications Given: None  Imaging was reviewed which revealed no acute fracture or dislocation.  Given no tenderness of the bone, low concern for any occult fracture.  He does report having a seizure last week, question if he may have had a dislocation.Patient was placed into a shoulder sling to help immobilize the shoulder.  Will have him follow-up with orthopedics regarding his left shoulder pain.  He is neurovascularly intact in the left upper extremity, no concern for vascular or nerve injury at this time. Patient reported a seizure last week at home.  He states he did not follow-up with neurology after his last seizure  since they were supposed to call him and he never received a call.  I discussed with patient that he needs to follow-up with neurology.  I discussed with patient that I will send a referral, but he also needs to call their office to schedule an appointment.  We discussed seizure precautions including not driving, operating heavy machinery, swimming until cleared by neurology.  He verbalized understanding. Patient's heart rate is elevated in the 120 range here today.  Patient extremely anxious appearing.  He states that he is very anxious today regarding the pain in his left shoulder and about medical bills from today since he is uninsured.  I discussed with patient that I would like to treat his pain and anxiety here today to see if  this helps with his elevated heart rate, as if it does not improve with anxiety and pain control, he may need further workup into his elevated heart rate.  He declines any further interventions today, including any anxiety or pain medication.  He declines any laboratory testing. I discussed the risks of leaving AMA with the patient including possible worsening of symptoms, disability, death.  Patient is not confused and does not appear intoxicated. Patient has decision making capacity. Patient verbalized understanding of these risks and wishes to leave.     Impression: Left shoulder pain  Disposition:  Patient left AMA with instructions to follow-up with neurology within the next 2 weeks for further evaluation of his potential seizures.  I have sent a referral and patient also was in instructed to call their office to schedule an appointment.  Follow-up with orthopedics within the next week for follow-up with his shoulder.  Contact information for Dr. Sherida was provided and patient understands he needs to call to schedule this follow-up appointment.  Tylenol and ibuprofen as needed for pain.  Wear shoulder sling provided at all times until cleared by orthopedics. Return  precautions given and patient verbalized understanding.    Record Review: External records from outside source obtained and reviewed including ER note from 10/30/2024 where patient was seen for new onset seizures and told to follow-up with Dr. Michaela     This chart was dictated using voice recognition software, Dragon. Despite the best efforts of this provider to proofread and correct errors, errors may still occur which can change documentation meaning.       Final diagnoses:  Acute pain of left shoulder  Seizure [facility])    ED Discharge Orders          Ordered    Ambulatory referral to Neurology       Comments: An appointment is requested in approximately: 2 weeks   11/07/24 1623               Veta Palma, PA-C 11/07/24 1628    Darra Fonda MATSU, MD 11/07/24 [phone]

## 2024-11-07 NOTE — Discharge Instructions (Signed)
 The x-ray of your left shoulder does not show any any fracture or dislocation today.  It is possible that your shoulder dislocated previously and popped back into place on its own.  This could cause your shoulder discomfort.  You have been placed into a shoulder sling to help provide support for your shoulder.  Please wear this at all times.  You may take this off to shower.  Medications: You may take up to 1000mg  of tylenol every 6 hours as needed for pain. Do not take more then 4g per day.   You may use up to 600mg  ibuprofen every 6 hours as needed for pain.  Do not exceed 2.4g of ibuprofen per day.  Follow-Up: Please make a follow-up with the orthopedic provider, Dr. Sherida, listed below within the next week for further management of your shoulder pain.  If you did have a dislocation, it will be important to strengthen the shoulder muscles to prevent further dislocations.  They will be able to refer you to physical therapy for this if needed.  Please make a follow-up appointment with the neurology office listed below within the next 2 to 4 weeks.  I have also sent a referral for you.  However, if you do not hear from them within the next 2 days, please call their office to schedule an appointment   Return to the ER for any numbness, uncontrolled pain, any other new or concerning symptoms

## 2024-11-07 NOTE — ED Triage Notes (Signed)
 Reports L shoulder pain after reaching out for an object and hearing pop 1 week ago. states he thinks it is partially dislocated no obvious swelling or deformity   L radial pulse 3+, cap refill under 3 sec Pt shaking and appears nervous in triage
